# Patient Record
Sex: Male | Born: 1985 | Race: White | Hispanic: No | Marital: Married | Smoking: Never smoker
Health system: Southern US, Community
[De-identification: ages and names within clinical notes are randomized; demographics above are authoritative.]

## PROBLEM LIST (undated history)

## (undated) DIAGNOSIS — F419 Anxiety disorder, unspecified: Secondary | ICD-10-CM

## (undated) DIAGNOSIS — T148XXA Other injury of unspecified body region, initial encounter: Secondary | ICD-10-CM

## (undated) DIAGNOSIS — F32A Depression, unspecified: Secondary | ICD-10-CM

## (undated) DIAGNOSIS — F329 Major depressive disorder, single episode, unspecified: Secondary | ICD-10-CM

## (undated) HISTORY — DX: Major depressive disorder, single episode, unspecified: F32.9

## (undated) HISTORY — PX: MOUTH SURGERY: SHX715

## (undated) HISTORY — DX: Anxiety disorder, unspecified: F41.9

## (undated) HISTORY — DX: Other injury of unspecified body region, initial encounter: T14.8XXA

## (undated) HISTORY — DX: Depression, unspecified: F32.A

---

## 2017-02-01 ENCOUNTER — Emergency Department
Admission: EM | Admit: 2017-02-01 | Discharge: 2017-02-01 | Disposition: A | Discharge status: Home / Self Care | Payer: Worker's Compensation | Attending: Emergency Medicine | Admitting: Emergency Medicine

## 2017-02-01 ENCOUNTER — Emergency Department: Payer: Worker's Compensation

## 2017-02-01 ENCOUNTER — Encounter: Payer: Self-pay | Admitting: Emergency Medicine

## 2017-02-01 DIAGNOSIS — S20212A Contusion of left front wall of thorax, initial encounter: Secondary | ICD-10-CM | POA: Diagnosis not present

## 2017-02-01 DIAGNOSIS — Y9389 Activity, other specified: Secondary | ICD-10-CM | POA: Diagnosis not present

## 2017-02-01 DIAGNOSIS — Y929 Unspecified place or not applicable: Secondary | ICD-10-CM | POA: Diagnosis not present

## 2017-02-01 DIAGNOSIS — W1789XA Other fall from one level to another, initial encounter: Secondary | ICD-10-CM | POA: Insufficient documentation

## 2017-02-01 DIAGNOSIS — Y990 Civilian activity done for income or pay: Secondary | ICD-10-CM | POA: Insufficient documentation

## 2017-02-01 DIAGNOSIS — S32592A Other specified fracture of left pubis, initial encounter for closed fracture: Secondary | ICD-10-CM

## 2017-02-01 DIAGNOSIS — S79912A Unspecified injury of left hip, initial encounter: Secondary | ICD-10-CM | POA: Diagnosis present

## 2017-02-01 DIAGNOSIS — S7002XA Contusion of left hip, initial encounter: Secondary | ICD-10-CM

## 2017-02-01 DIAGNOSIS — S7012XA Contusion of left thigh, initial encounter: Secondary | ICD-10-CM | POA: Insufficient documentation

## 2017-02-01 DIAGNOSIS — S32512A Fracture of superior rim of left pubis, initial encounter for closed fracture: Secondary | ICD-10-CM | POA: Diagnosis not present

## 2017-02-01 DIAGNOSIS — W19XXXA Unspecified fall, initial encounter: Secondary | ICD-10-CM

## 2017-02-01 MED ORDER — IBUPROFEN 800 MG PO TABS
800.0000 mg | ORAL_TABLET | Freq: Once | ORAL | Status: AC
  Administered 2017-02-01: 800 mg via ORAL
  Filled 2017-02-01: qty 1

## 2017-02-01 MED ORDER — IBUPROFEN 800 MG PO TABS: 800.0000 mg | ORAL_TABLET | Freq: Three times a day (TID) | ORAL | 0 refills | Status: DC | PRN

## 2017-02-01 NOTE — ED Provider Notes (Signed)
[facility]-EMERGENCY DEPARTMENT Provider Note   CSN: [id number] Arrival date & time: 02/01/17  0944     History   Chief Complaint Chief Complaint  Patient presents with  . Fall    HPI Adley Mazurowski is a 31 y.o. male presents to the emergency department for evaluation of left lateral hip pain, left rib pain. Patient fell off of a stage approximately 6 feet around 9 AM this morning. Patient denies hitting his head, losing consciousness, having a headache or any vision changes. No nausea or vomiting. He states his pain is 4 out of 10 in the left hip and 3 out of 10 in the left ribs along the lateral left shoulder blade. He was able to ambulate but has moderate pain with ambulation along the left hip. He denies any chest pain or shortness of breath. He has not had any medication for pain. No numbness or tingling in the upper or lower extremities.  HPI  History reviewed. No pertinent past medical history.  There are no active problems to display for this patient.   Past Surgical History:  Procedure Laterality Date  . MOUTH SURGERY         Home Medications    Prior to Admission medications   Medication Sig Start Date End Date Taking? Authorizing Provider  ibuprofen (ADVIL,MOTRIN) 800 MG tablet Take 1 tablet (800 mg total) by mouth every 8 (eight) hours as needed. 02/01/17   Evon Slack, PA-C    Family History No family history on file.  Social History Social History  Substance Use Topics  . Smoking status: Never Smoker  . Smokeless tobacco: Never Used  . Alcohol use No     Allergies   Patient has no known allergies.   Review of Systems Review of Systems  Constitutional: Negative.  Negative for activity change, appetite change, chills and fever.  HENT: Negative for congestion, ear pain, mouth sores, rhinorrhea, sinus pressure, sore throat and trouble swallowing.   Eyes: Negative for photophobia, pain and discharge.  Respiratory: Negative for cough, chest tightness  and shortness of breath.   Cardiovascular: Negative for chest pain and leg swelling.       Left rib pain  Gastrointestinal: Negative for abdominal distention, abdominal pain, diarrhea, nausea and vomiting.  Genitourinary: Negative for difficulty urinating and dysuria.  Musculoskeletal: Positive for arthralgias and myalgias. Negative for back pain, gait problem and neck pain.  Skin: Negative for color change and rash.  Neurological: Negative for dizziness and headaches.  Hematological: Negative for adenopathy.  Psychiatric/Behavioral: Negative for agitation and behavioral problems.     Physical Exam Updated Vital Signs BP 120/70 (BP Location: Right Arm)   Pulse 63   Temp 98.4 F (36.9 C) (Oral)   Resp 16   Wt 76.2 kg   SpO2 100%   Physical Exam  Constitutional: He appears well-developed and well-nourished.  HENT:  Head: Normocephalic and atraumatic.  Right Ear: External ear normal.  Left Ear: External ear normal.  Nose: Nose normal.  Eyes: Conjunctivae and EOM are normal. Pupils are equal, round, and reactive to light. Right eye exhibits no discharge. Left eye exhibits no discharge.  Neck: Normal range of motion. Neck supple.  Cardiovascular: Normal rate and regular rhythm.   No murmur heard. Pulmonary/Chest: Effort normal. No respiratory distress. He has no wheezes. He has no rales. He exhibits no tenderness.  Abdominal: Soft. There is no tenderness. There is no guarding.  Musculoskeletal:  Patient has no spinous process tenderness along the cervical  thoracic or lumbar spine area and there is no bruising or swelling. Patient has full range of motion of the cervical thoracic and lumbar spine with no discomfort. Patient has tenderness along the left lateral scapular border along the left ribs with no step-off noted. There is no bruising. He is also tender along the left trochanteric bursa with no bruising or ecchymosis noted. He is able to straight leg raise. He has negative  logroll test. Mild tenderness on the left inferior pubic Rami to palpation. Patient has no pain with resisted knee flexion or extension.  Neurological: He is alert. Coordination normal.  Skin: Skin is warm and dry. No rash noted. No erythema.  Psychiatric: He has a normal mood and affect.  Nursing note and vitals reviewed.    ED Treatments / Results  Labs (all labs ordered are listed, but only abnormal results are displayed) Labs Reviewed - No data to display  EKG  EKG Interpretation None       Radiology Dg Ribs Unilateral W/chest Left  Result Date: 02/01/2017 CLINICAL DATA:  Larey Seat and injured left chest/ribs this morning. EXAM: LEFT RIBS AND CHEST - 3+ VIEW COMPARISON:  None. FINDINGS: The cardiac silhouette, mediastinal and hilar contours are normal. The lungs are clear. No pleural effusion or pneumothorax. Dedicated views of the left ribs do not demonstrate any definite acute left-sided rib fractures. No pleural thickening or pleural effusion. IMPRESSION: No acute cardiopulmonary findings and no definite left-sided rib fractures. Electronically Signed   By: Rudie Meyer M.D.   On: 02/01/2017 11:15   Dg Hip Unilat W Or Wo Pelvis 2-3 Views Left  Result Date: 02/01/2017 CLINICAL DATA:  Larey Seat 6 feet and injured left hip this morning. EXAM: DG HIP (WITH OR WITHOUT PELVIS) 2-3V LEFT COMPARISON:  None. FINDINGS: Both hips are normally located. No acute hip fracture. However, superior and inferior pubic rami fractures are noted. The pubic symphysis and SI joints are intact. IMPRESSION: Superior and inferior pubic rami fractures on the left side. No hip fracture. Electronically Signed   By: Rudie Meyer M.D.   On: 02/01/2017 11:17    Procedures Procedures (including critical care time)  Medications Ordered in ED Medications  ibuprofen (ADVIL,MOTRIN) tablet 800 mg (800 mg Oral Given 02/01/17 1109)     Initial Impression / Assessment and Plan / ED Course  I have reviewed the triage vital  signs and the nursing notes.  Pertinent labs & imaging results that were available during my care of the patient were reviewed by me and considered in my medical decision making (see chart for details).     31 year old male with fall just prior to arrival, x-ray showed no evidence of fracture along the chest her ribs. No pneumothorax. He does have a superior and inferior pubic rami fractures of the left pelvis. He is given crutches to help with ambulation. He will avoid sitting on a hard surface. He is given prescription for ibuprofen. Will follow-up with orthopedics.  Final Clinical Impressions(s) / ED Diagnoses   Final diagnoses:  Fall, initial encounter  Contusion of left hip and thigh, initial encounter  Rib contusion, left, initial encounter  Closed fracture of multiple pubic rami, left, initial encounter (HCC)    New Prescriptions New Prescriptions   IBUPROFEN (ADVIL,MOTRIN) 800 MG TABLET    Take 1 tablet (800 mg total) by mouth every 8 (eight) hours as needed.     Evon Slack, PA-C 02/01/17 1132    Jene Every, MD 02/01/17 [phone]

## 2017-02-01 NOTE — Discharge Instructions (Signed)
Please take ibuprofen as prescribed. Use crutches as needed for ambulation. Follow-up with orthopedics Monday or Tuesday morning and schedule follow-up appointment for your pelvic fracture.

## 2017-02-01 NOTE — ED Triage Notes (Signed)
Pt states that he was working today and fell through a hole in the stage. Pt currently c/o left leg pain and left scapula pain. Pt denies LOC, does not take blood thinners. Pt denies hitting head.

## 2018-02-02 IMAGING — CR DG HIP (WITH OR WITHOUT PELVIS) 2-3V*L*
1 series · 3 of 3 positions shown · non-contrast
Comparison: None.

CLINICAL DATA: Fell 6 feet and injured left hip this morning.

EXAM:
DG HIP (WITH OR WITHOUT PELVIS) 2-3V LEFT

[Series 1: dg hip unilat w or w/o pelvis 2-3 views  · non-contrast · 0.14mm/px · 3 of 3 slices shown]
[im 1/3]
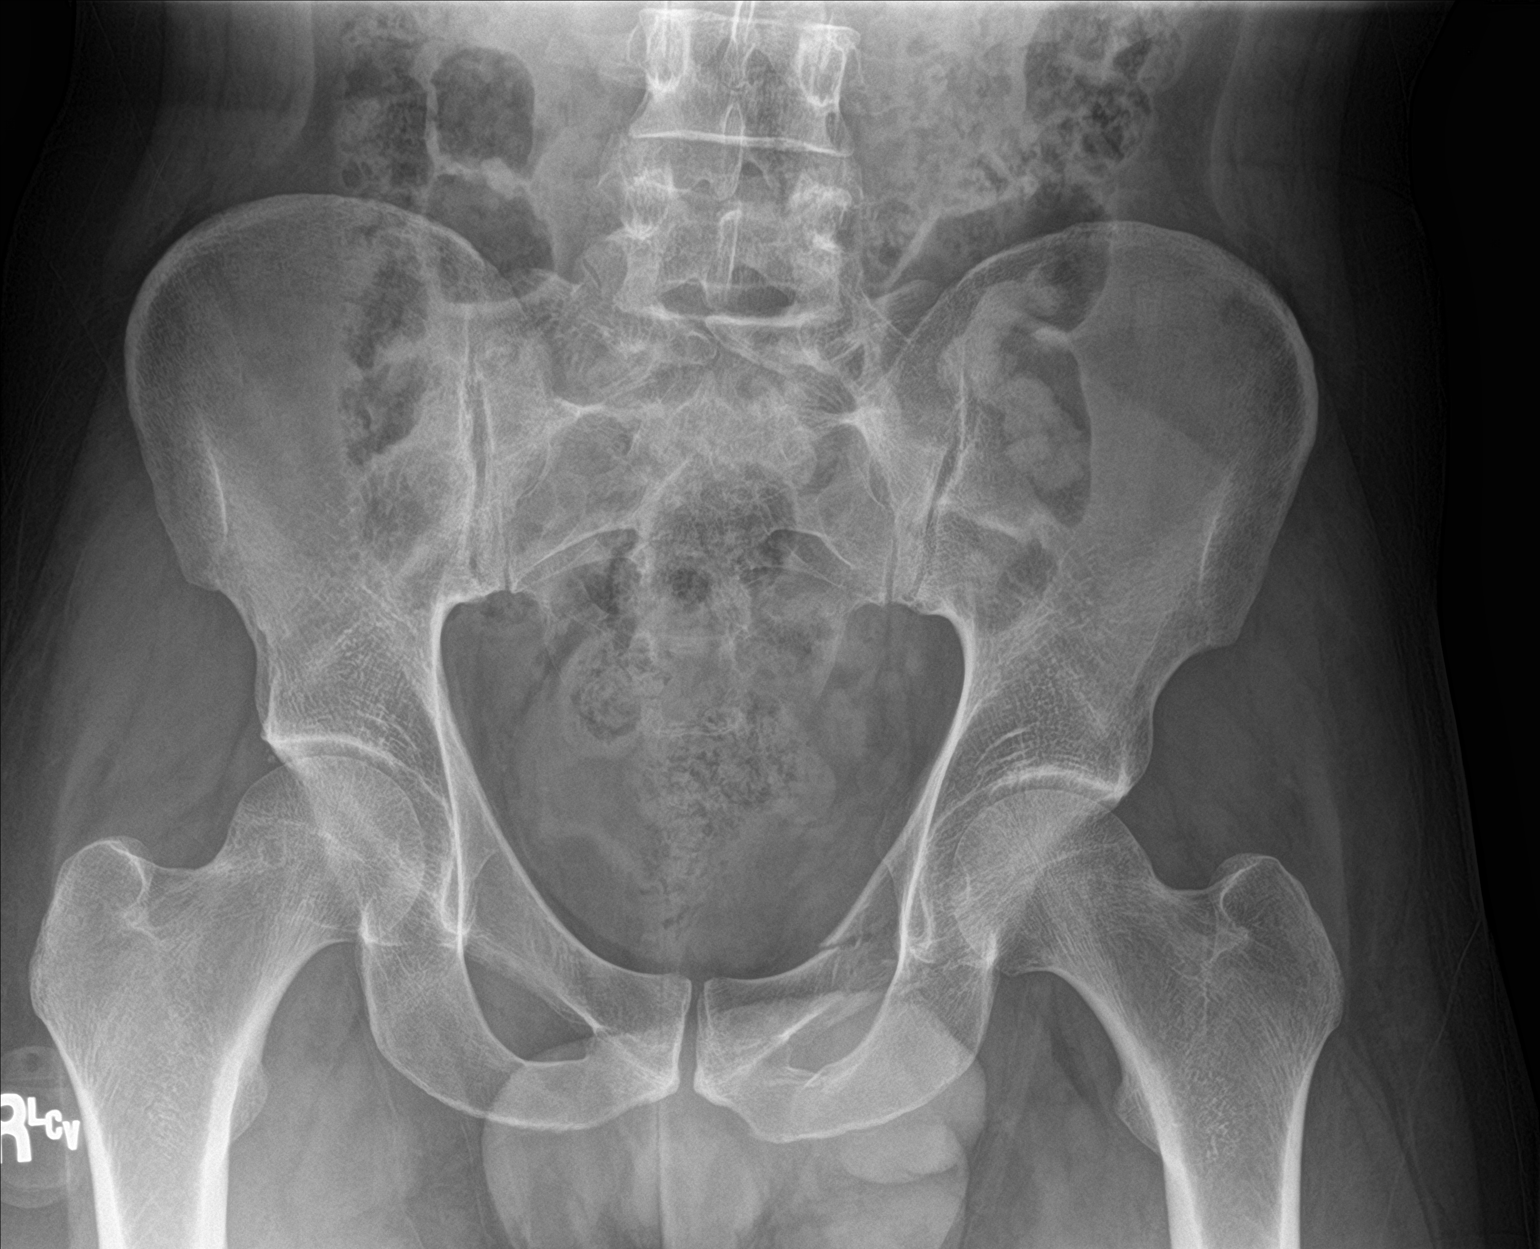
[im 2/3]
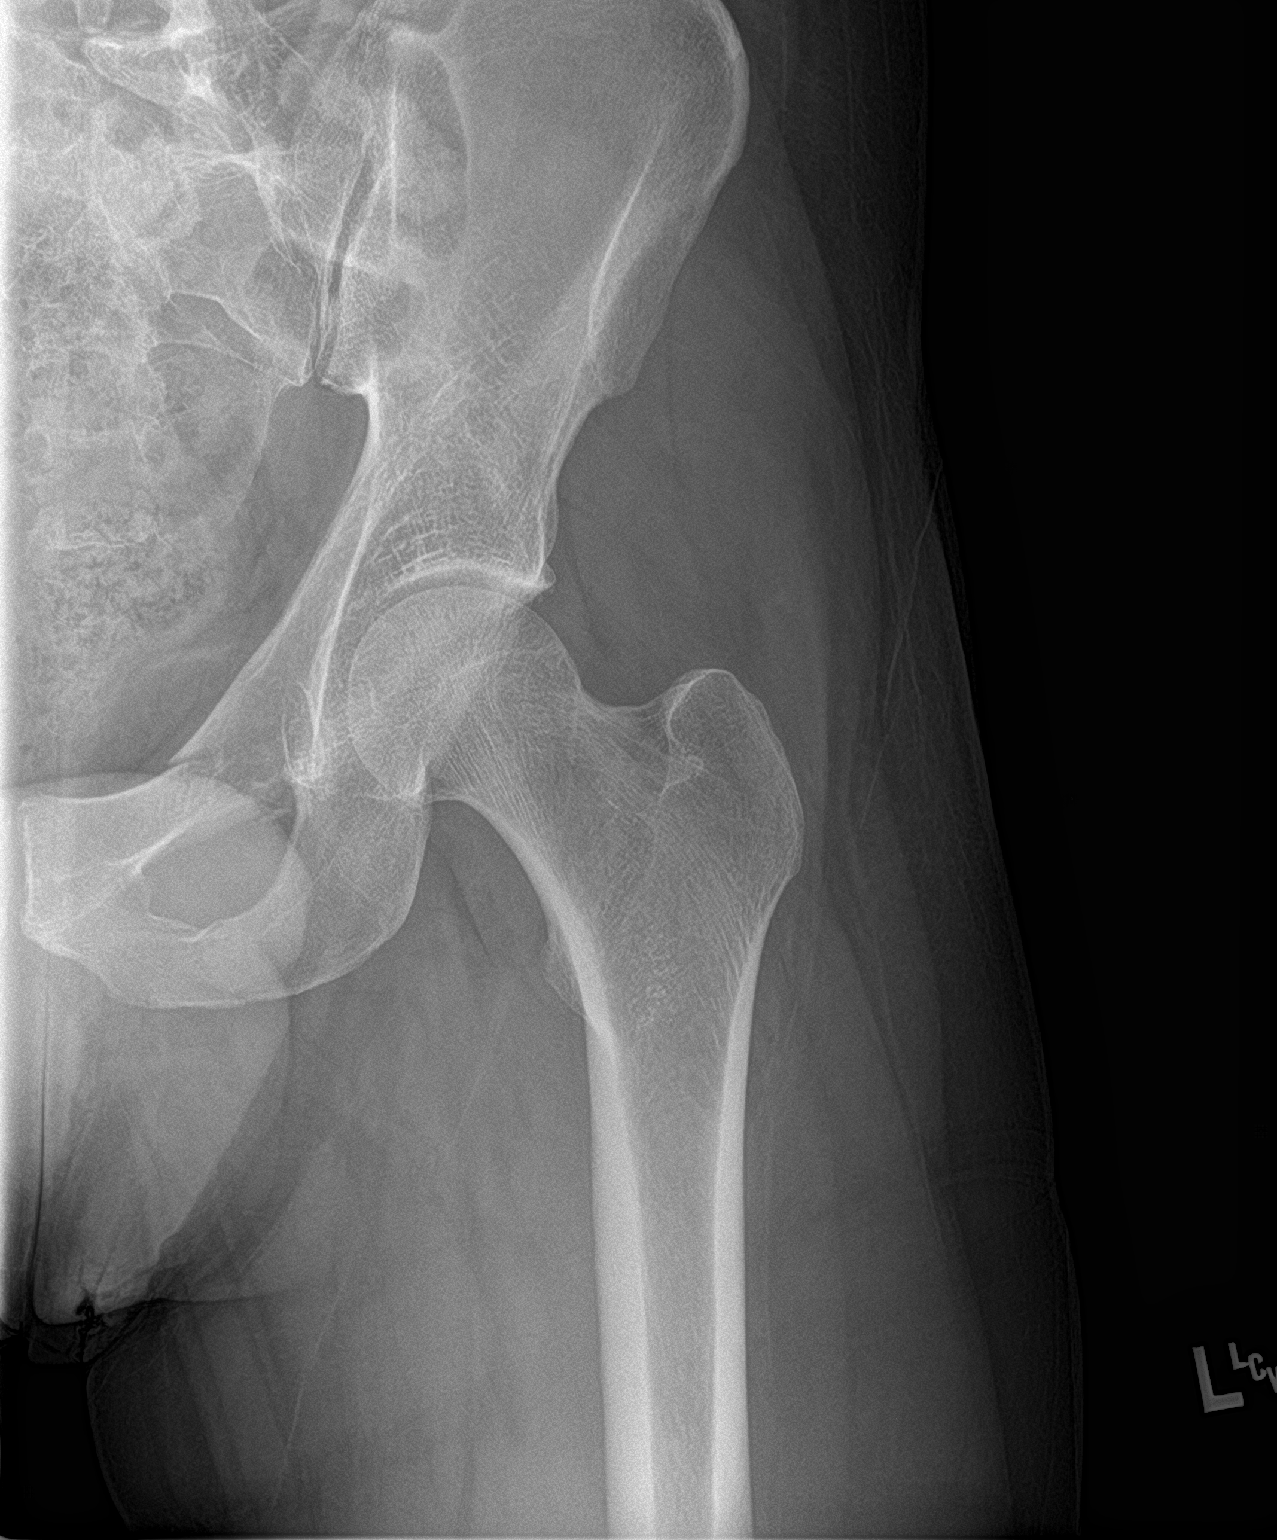
[im 3/3]
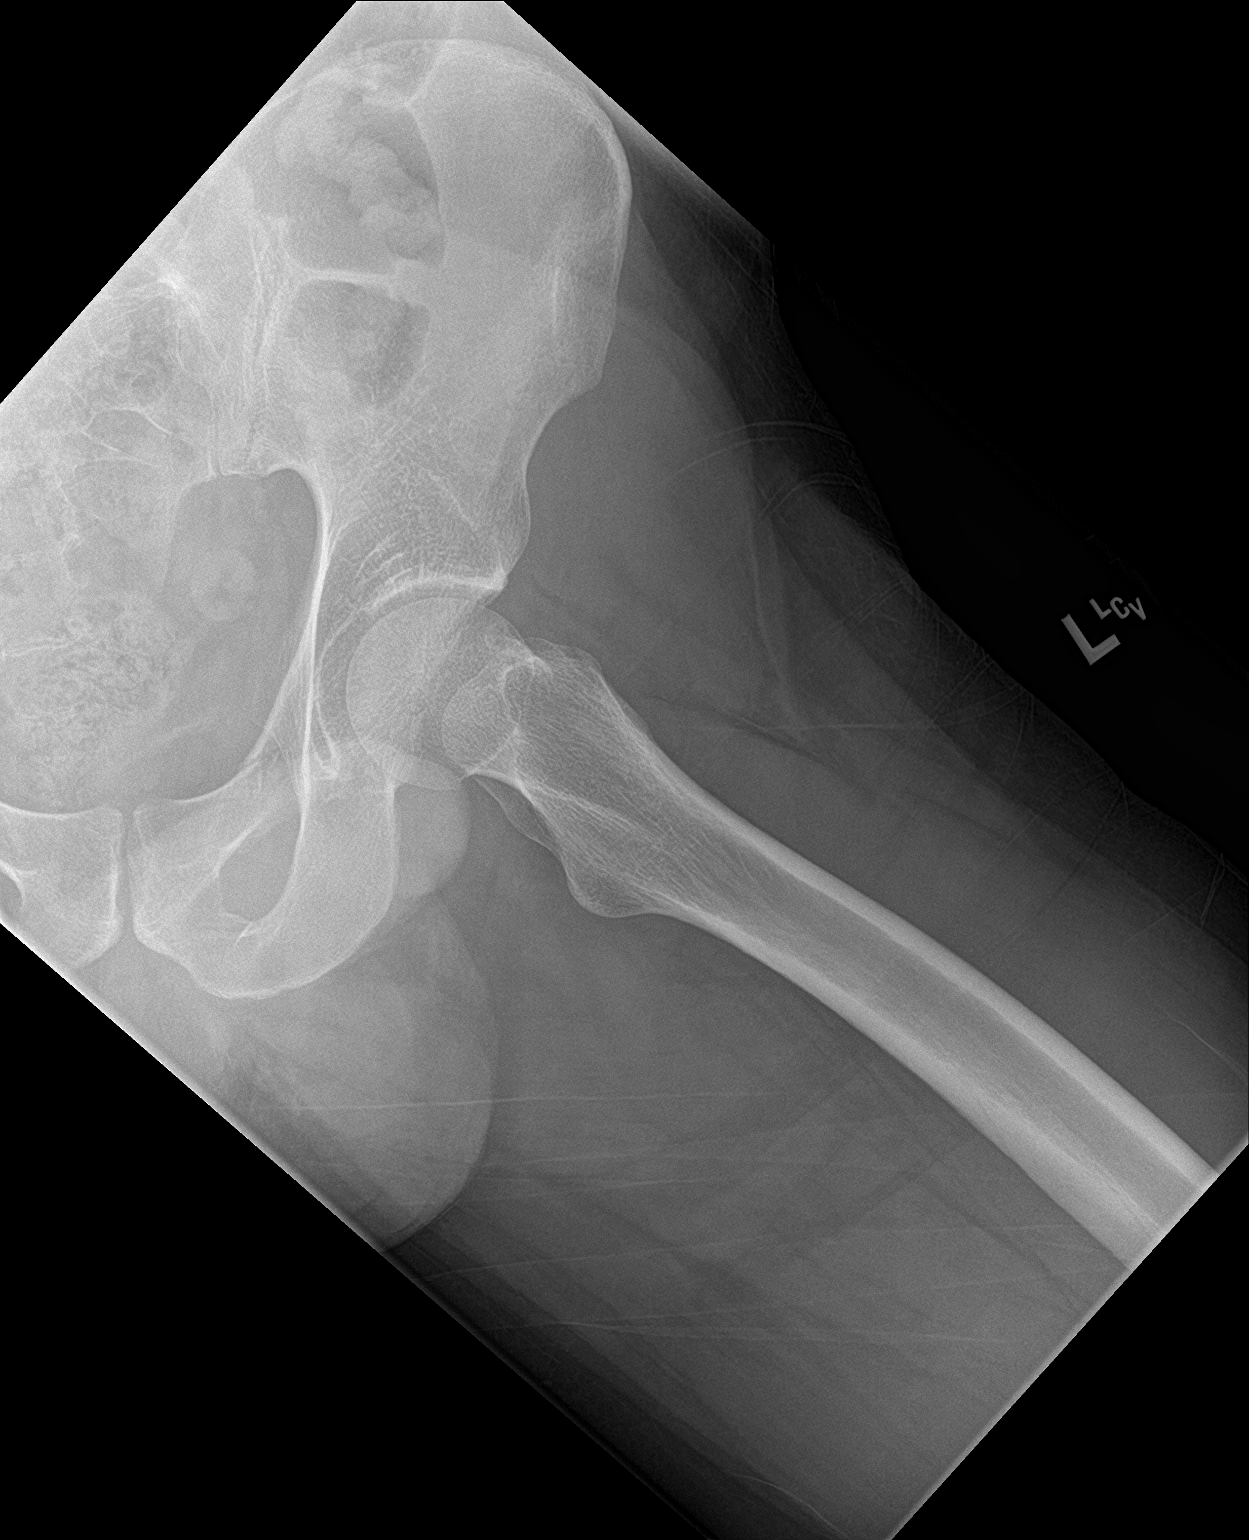

[3 of 3 positions shown; findings below may reference images not displayed]

FINDINGS: Both hips are normally located. No acute hip fracture. However,
superior and inferior pubic rami fractures are noted. The pubic
symphysis and SI joints are intact.
IMPRESSION: Superior and inferior pubic rami fractures on the left side. No hip
fracture.

## 2018-07-26 ENCOUNTER — Ambulatory Visit: Payer: Self-pay | Admitting: Family Medicine

## 2018-08-13 ENCOUNTER — Other Ambulatory Visit: Payer: Self-pay

## 2018-08-13 ENCOUNTER — Other Ambulatory Visit: Payer: Self-pay | Admitting: Family Medicine

## 2018-08-13 ENCOUNTER — Encounter: Payer: Self-pay | Admitting: Family Medicine

## 2018-08-13 ENCOUNTER — Ambulatory Visit (INDEPENDENT_AMBULATORY_CARE_PROVIDER_SITE_OTHER): Payer: 59 | Admitting: Family Medicine

## 2018-08-13 VITALS — BP 116/74 | HR 56 | Temp 98.2°F | Ht 69.0 in | Wt 171.3 lb

## 2018-08-13 DIAGNOSIS — F419 Anxiety disorder, unspecified: Secondary | ICD-10-CM

## 2018-08-13 DIAGNOSIS — R197 Diarrhea, unspecified: Secondary | ICD-10-CM | POA: Diagnosis not present

## 2018-08-13 DIAGNOSIS — Z13220 Encounter for screening for lipoid disorders: Secondary | ICD-10-CM | POA: Diagnosis not present

## 2018-08-13 DIAGNOSIS — H9312 Tinnitus, left ear: Secondary | ICD-10-CM | POA: Diagnosis not present

## 2018-08-13 DIAGNOSIS — Z23 Encounter for immunization: Secondary | ICD-10-CM | POA: Diagnosis not present

## 2018-08-13 NOTE — Patient Instructions (Addendum)
[email or web address]  Lactobacilis and Acidophilus  Tdap Vaccine (Tetanus, Diphtheria and Pertussis): What You Need to Know 1. Why get vaccinated? Tetanus, diphtheria and pertussis are very serious diseases. Tdap vaccine can protect [place] from these diseases. And, Tdap vaccine given to pregnant women can protect newborn babies against pertussis. TETANUS (Lockjaw) is rare in the [place] today. It causes painful muscle tightening and stiffness, usually all over the body.  It can lead to tightening of muscles in the head and neck so you can't open your mouth, swallow, or sometimes even breathe. Tetanus kills about 1 out of 10 people who are infected even after receiving the best medical care.  DIPHTHERIA is also rare in the [place] today. It can cause a thick coating to form in the back of the throat.  It can lead to breathing problems, heart failure, paralysis, and death.  PERTUSSIS (Whooping Cough) causes severe coughing spells, which can cause difficulty breathing, vomiting and disturbed sleep.  It can also lead to weight loss, incontinence, and rib fractures. Up to 2 in 100 adolescents and 5 in 100 adults with pertussis are hospitalized or have complications, which could include pneumonia or death.  These diseases are caused by bacteria. Diphtheria and pertussis are spread from person to person through secretions from coughing or sneezing. Tetanus enters the body through cuts, scratches, or wounds. Before vaccines, as many as 200,000 cases of diphtheria, 200,000 cases of pertussis, and hundreds of cases of tetanus, were reported in the [place] each year. Since vaccination began, reports of cases for tetanus and diphtheria have dropped by about 99% and for pertussis by about 80%. 2. Tdap vaccine Tdap vaccine can protect adolescents and adults from tetanus, diphtheria, and pertussis. One dose of Tdap is routinely given at age 20 or 90. People who did not get Tdap at that age should  get it as soon as possible. Tdap is especially important for healthcare professionals and anyone having close contact with a baby younger than 12 months. Pregnant women should get a dose of Tdap during every pregnancy, to protect the newborn from pertussis. Infants are most at risk for severe, life-threatening complications from pertussis. Another vaccine, called Td, protects against tetanus and diphtheria, but not pertussis. A Td booster should be given every 10 years. Tdap may be given as one of these boosters if you have never gotten Tdap before. Tdap may also be given after a severe cut or burn to prevent tetanus infection. Your doctor or the person giving you the vaccine can give you more information. Tdap may safely be given at the same time as other vaccines. 3. Some people should not get this vaccine  A person who has ever had a life-threatening allergic reaction after a previous dose of any diphtheria, tetanus or pertussis containing vaccine, OR has a severe allergy to any part of this vaccine, should not get Tdap vaccine. Tell the person giving the vaccine about any severe allergies.  Anyone who had coma or long repeated seizures within 7 days after a childhood dose of DTP or DTaP, or a previous dose of Tdap, should not get Tdap, unless a cause other than the vaccine was found. They can still get Td.  Talk to your doctor if you: ? have seizures or another nervous system problem, ? had severe pain or swelling after any vaccine containing diphtheria, tetanus or pertussis, ? ever had a condition called Guillain-Barr Syndrome (GBS), ? aren't feeling well on the day the shot is scheduled.  4. Risks With any medicine, including vaccines, there is a chance of side effects. These are usually mild and go away on their own. Serious reactions are also possible but are rare. Most people who get Tdap vaccine do not have any problems with it. Mild problems following Tdap: (Did not interfere with  activities)  Pain where the shot was given (about 3 in 4 adolescents or 2 in 3 adults)  Redness or swelling where the shot was given (about 1 person in 5)  Mild fever of at least 100.21F (up to about 1 in 25 adolescents or 1 in 100 adults)  Headache (about 3 or 4 people in 10)  Tiredness (about 1 person in 3 or 4)  Nausea, vomiting, diarrhea, stomach ache (up to 1 in 4 adolescents or 1 in 10 adults)  Chills, sore joints (about 1 person in 10)  Body aches (about 1 person in 3 or 4)  Rash, swollen glands (uncommon)  Moderate problems following Tdap: (Interfered with activities, but did not require medical attention)  Pain where the shot was given (up to 1 in 5 or 6)  Redness or swelling where the shot was given (up to about 1 in 16 adolescents or 1 in 12 adults)  Fever over 102F (about 1 in 100 adolescents or 1 in 250 adults)  Headache (about 1 in 7 adolescents or 1 in 10 adults)  Nausea, vomiting, diarrhea, stomach ache (up to 1 or 3 people in 100)  Swelling of the entire arm where the shot was given (up to about 1 in 500).  Severe problems following Tdap: (Unable to perform usual activities; required medical attention)  Swelling, severe pain, bleeding and redness in the arm where the shot was given (rare).  Problems that could happen after any vaccine:  People sometimes faint after a medical procedure, including vaccination. Sitting or lying down for about 15 minutes can help prevent fainting, and injuries caused by a fall. Tell your doctor if you feel dizzy, or have vision changes or ringing in the ears.  Some people get severe pain in the shoulder and have difficulty moving the arm where a shot was given. This happens very rarely.  Any medication can cause a severe allergic reaction. Such reactions from a vaccine are very rare, estimated at fewer than 1 in a million doses, and would happen within a few minutes to a few hours after the vaccination. As with any  medicine, there is a very remote chance of a vaccine causing a serious injury or death. The safety of vaccines is always being monitored. For more information, visit: http://floyd.org/ 5. What if there is a serious problem? What should I look for? Look for anything that concerns you, such as signs of a severe allergic reaction, very high fever, or unusual behavior. Signs of a severe allergic reaction can include hives, swelling of the face and throat, difficulty breathing, a fast heartbeat, dizziness, and weakness. These would usually start a few minutes to a few hours after the vaccination. What should I do?  If you think it is a severe allergic reaction or other emergency that can't wait, call 9-1-1 or get the person to the nearest hospital. Otherwise, call your doctor.  Afterward, the reaction should be reported to the Vaccine Adverse Event Reporting System (VAERS). Your doctor might file this report, or you can do it yourself through the VAERS web site at www.vaers.LAgents.no, or by calling 1-(939) 451-7164. ? VAERS does not give medical advice. 6. The [employer]  Vaccine Injury Compensation Program The National Vaccine Injury Compensation Program (VICP) is a federal program that was created to compensate people who may have been injured by certain vaccines. Persons who believe they may have been injured by a vaccine can learn about the program and about filing a claim by calling 1-3862902910 or visiting the VICP website at SpiritualWord.at. There is a time limit to file a claim for compensation. 7. How can I learn more?  Ask your doctor. He or she can give you the vaccine package insert or suggest other sources of information.  Call your local or state health department.  Contact the Centers for Disease Control and Prevention (CDC): ? Call 8174652867 (1-800-CDC-INFO) or ? Visit CDC's website at PicCapture.uy CDC Tdap Vaccine VIS (12/20/13) This information  is not intended to replace advice given to you by your health care provider. Make sure you discuss any questions you have with your health care provider. Document Released: 04/13/2012 Document Revised: 07/03/2016 Document Reviewed: 07/03/2016 Elsevier Interactive Patient Education  2017 [employer].  Food Choices to Help Relieve Diarrhea, Adult When you have diarrhea, the foods you eat and your eating habits are very important. Choosing the right foods and drinks can help:  Relieve diarrhea.  Replace lost fluids and nutrients.  Prevent dehydration.  What general guidelines should I follow? Relieving diarrhea  Choose foods with less than 2 g or .07 oz. of fiber per serving.  Limit fats to less than 8 tsp (38 g or 1.34 oz.) a day.  Avoid the following: ? Foods and beverages sweetened with high-fructose corn syrup, honey, or sugar alcohols such as xylitol, sorbitol, and mannitol. ? Foods that contain a lot of fat or sugar. ? Fried, greasy, or spicy foods. ? High-fiber grains, breads, and cereals. ? Raw fruits and vegetables.  Eat foods that are rich in probiotics. These foods include dairy products such as yogurt and fermented milk products. They help increase healthy bacteria in the stomach and intestines (gastrointestinal tract, or GI tract).  If you have lactose intolerance, avoid dairy products. These may make your diarrhea worse.  Take medicine to help stop diarrhea (antidiarrheal medicine) only as told by your health care provider. Replacing nutrients  Eat small meals or snacks every 3-4 hours.  Eat bland foods, such as white rice, toast, or baked potato, until your diarrhea starts to get better. Gradually reintroduce nutrient-rich foods as tolerated or as told by your health care provider. This includes: ? Well-cooked protein foods. ? Peeled, seeded, and soft-cooked fruits and vegetables. ? Low-fat dairy products.  Take vitamin and mineral supplements as told by your  health care provider. Preventing dehydration   Start by sipping water or a special solution to prevent dehydration (oral rehydration solution, ORS). Urine that is clear or pale yellow means that you are getting enough fluid.  Try to drink at least 8-10 cups of fluid each day to help replace lost fluids.  You may add other liquids in addition to water, such as clear juice or decaffeinated sports drinks, as tolerated or as told by your health care provider.  Avoid drinks with caffeine, such as coffee, tea, or soft drinks.  Avoid alcohol. What foods are recommended? The items listed may not be a complete list. Talk with your health care provider about what dietary choices are best for you. Grains White rice. White, [place], or pita breads (fresh or toasted), including plain rolls, buns, or bagels. White pasta. Saltine, soda, or graham crackers. Pretzels. Low-fiber cereal.  Cooked cereals made with water (such as cornmeal, farina, or cream cereals). Plain muffins. Matzo. Melba toast. Zwieback. Vegetables Potatoes (without the skin). Most well-cooked and canned vegetables without skins or seeds. Tender lettuce. Fruits Apple sauce. Fruits canned in juice. Cooked apricots, cherries, grapefruit, peaches, pears, or plums. Fresh bananas and cantaloupe. Meats and other protein foods Baked or boiled chicken. Eggs. Tofu. Fish. Seafood. Smooth nut butters. Ground or well-cooked tender beef, ham, veal, lamb, pork, or poultry. Dairy Plain yogurt, kefir, and unsweetened liquid yogurt. Lactose-free milk, buttermilk, skim milk, or soy milk. Low-fat or nonfat hard cheese. Beverages Water. Low-calorie sports drinks. Fruit juices without pulp. Strained tomato and vegetable juices. Decaffeinated teas. Sugar-free beverages not sweetened with sugar alcohols. Oral rehydration solutions, if approved by your health care provider. Seasoning and other foods Bouillon, broth, or soups made from recommended foods. What  foods are not recommended? The items listed may not be a complete list. Talk with your health care provider about what dietary choices are best for you. Grains Whole grain, whole wheat, bran, or rye breads, rolls, pastas, and crackers. Wild or brown rice. Whole grain or bran cereals. Barley. Oats and oatmeal. Corn tortillas or taco shells. Granola. Popcorn. Vegetables Raw vegetables. Fried vegetables. Cabbage, broccoli, Brussels sprouts, artichokes, baked beans, beet greens, corn, kale, legumes, peas, sweet potatoes, and yams. Potato skins. Cooked spinach and cabbage. Fruits Dried fruit, including raisins and dates. Raw fruits. Stewed or dried prunes. Canned fruits with syrup. Meat and other protein foods Fried or fatty meats. Deli meats. Chunky nut butters. Nuts and seeds. Beans and lentils. Tomasa Blase. Hot dogs. Sausage. Dairy High-fat cheeses. Whole milk, chocolate milk, and beverages made with milk, such as milk shakes. Half-and-half. Cream. sour cream. Ice cream. Beverages Caffeinated beverages (such as coffee, tea, soda, or energy drinks). Alcoholic beverages. Fruit juices with pulp. Prune juice. Soft drinks sweetened with high-fructose corn syrup or sugar alcohols. High-calorie sports drinks. Fats and oils Butter. Cream sauces. Margarine. Salad oils. Plain salad dressings. Olives. Avocados. Mayonnaise. Sweets and desserts Sweet rolls, doughnuts, and sweet breads. Sugar-free desserts sweetened with sugar alcohols such as xylitol and sorbitol. Seasoning and other foods Honey. Hot sauce. Chili powder. Gravy. Cream-based or milk-based soups. Pancakes and waffles. Summary  When you have diarrhea, the foods you eat and your eating habits are very important.  Make sure you get at least 8-10 cups of fluid each day, or enough to keep your urine clear or pale yellow.  Eat bland foods and gradually reintroduce healthy, nutrient-rich foods as tolerated, or as told by your health care  provider.  Avoid high-fiber, fried, greasy, or spicy foods. This information is not intended to replace advice given to you by your health care provider. Make sure you discuss any questions you have with your health care provider. Document Released: 01/03/2004 Document Revised: 10/10/2016 Document Reviewed: 10/10/2016 Elsevier Interactive Patient Education  2018 [employer]. Probiotics What are probiotics? Probiotics are the good bacteria and yeasts that live in your body and keep you and your digestive system healthy. Probiotics also help your body's defense (immune) system and protect your body against bad bacterial growth. Certain foods contain probiotics, such as yogurt. Probiotics can also be purchased as a supplement. As with any supplement or drug, it is important to discuss its use with your health care provider. What affects the balance of bacteria in my body? The balance of bacteria in your body can be affected by:  Antibiotic medicines. Antibiotics are sometimes necessary to treat  infection. Unfortunately, they may kill good or friendly bacteria in your body as well as the bad bacteria. This may lead to stomach problems like diarrhea, gas, and cramping.  Disease. Some conditions are the result of an overgrowth of bad bacteria, yeasts, parasites, or fungi. These conditions include: ? Infectious diarrhea. ? Stomach and respiratory infections. ? Skin infections. ? Irritable bowel syndrome (IBS). ? Inflammatory bowel diseases. ? Ulcer due to Helicobacter pylori (H. pylori) infection. ? Tooth decay and periodontal disease. ? Vaginal infections.  Stress and poor diet may also lower the good bacteria in your body. What type of probiotic is right for me? Probiotics are available over the counter at your local pharmacy, health food, or grocery store. They come in many different forms, combinations of strains, and dosing strengths. Some may need to be refrigerated. Always read the label  for storage and usage instructions. Specific strains have been shown to be more effective for certain conditions. Ask your health care provider what option is best for you. Why would I need probiotics? There are many reasons your health care provider might recommend a probiotic supplement, including:  Diarrhea.  Constipation.  IBS.  Respiratory infections.  Yeast infections.  Acne, eczema, and other skin conditions.  Frequent urinary tract infections (UTIs).  Are there side effects of probiotics? Some people experience mild side effects when taking probiotics. Side effects are usually temporary and may include:  Gas.  Bloating.  Cramping.  Rarely, serious side effects, such as infection or immune system changes, may occur. What else do I need to know about probiotics?  There are many different strains of probiotics. Certain strains may be more effective depending on your condition. Probiotics are available in varying doses. Ask your health care provider which probiotic you should use and how often.  If you are taking probiotics along with antibiotics, it is generally recommended to wait at least 2 hours between taking the antibiotic and taking the probiotic. For more information: [facility] for Complementary and Alternative Medicine http://potts.com/ This information is not intended to replace advice given to you by your health care provider. Make sure you discuss any questions you have with your health care provider. Document Released: 05/10/2014 Document Revised: 09/09/2016 Document Reviewed: 01/10/2014 Elsevier Interactive Patient Education  2017 [employer]. Tinnitus Tinnitus refers to hearing a sound when there is no actual source for that sound. This is often described as ringing in the ears. However, people with this condition may hear a variety of noises. A person may hear the sound in one ear or in both ears. The sounds of tinnitus can be soft, loud, or  somewhere in between. Tinnitus can last for a few seconds or can be constant for days. It may go away without treatment and come back at various times. When tinnitus is constant or happens often, it can lead to other problems, such as trouble sleeping and trouble concentrating. Almost everyone experiences tinnitus at some point. Tinnitus that is long-lasting (chronic) or comes back often is a problem that may require medical attention. What are the causes? The cause of tinnitus is often not known. In some cases, it can result from other problems or conditions, including:  Exposure to loud noises from machinery, music, or other sources.  Hearing loss.  Ear or sinus infections.  Earwax buildup.  A foreign object in the ear.  Use of certain medicines.  Use of alcohol and caffeine.  High blood pressure.  Heart diseases.  Anemia.  Allergies.  Meniere disease.  Thyroid problems.  Tumors.  An enlarged part of a weakened blood vessel (aneurysm).  What are the signs or symptoms? The main symptom of tinnitus is hearing a sound when there is no source for that sound. It may sound like:  Buzzing.  Roaring.  Ringing.  Blowing air, similar to the sound heard when you listen to a seashell.  Hissing.  Whistling.  Sizzling.  Humming.  Running water.  A sustained musical note.  How is this diagnosed? Tinnitus is diagnosed based on your symptoms. Your health care provider will do a physical exam. A comprehensive hearing exam (audiologic exam) will be done if your tinnitus:  Affects only one ear (unilateral).  Causes hearing difficulties.  Lasts 6 months or longer.  You may also need to see a health care provider who specializes in hearing disorders (audiologist). You may be asked to complete a questionnaire to determine the severity of your tinnitus. Tests may be done to help determine the cause and to rule out other conditions. These can include:  Imaging studies of  your head and brain, such as: ? A CT scan. ? An MRI.  An imaging study of your blood vessels (angiogram).  How is this treated? Treating an underlying medical condition can sometimes make tinnitus go away. If your tinnitus continues, other treatments may include:  Medicines, such as certain antidepressants or sleeping aids.  Sound generators to mask the tinnitus. These include: ? Tabletop sound machines that play relaxing sounds to help you fall asleep. ? Wearable devices that fit in your ear and play sounds or music. ? A small device that uses headphones to deliver a signal embedded in music (acoustic neural stimulation). In time, this may change the pathways of your brain and make you less sensitive to tinnitus. This device is used for very severe cases when no other treatment is working.  Therapy and counseling to help you manage the stress of living with tinnitus.  Using hearing aids or cochlear implants, if your tinnitus is related to hearing loss.  Follow these instructions at home:  When possible, avoid being in loud places and being exposed to loud sounds.  Wear hearing protection, such as earplugs, when you are exposed to loud noises.  Do not take stimulants, such as nicotine, alcohol, or caffeine.  Practice techniques for reducing stress, such as meditation, yoga, or deep breathing.  Use a white noise machine, a humidifier, or other devices to mask the sound of tinnitus.  Sleep with your head slightly raised. This may reduce the impact of tinnitus.  Try to get plenty of rest each night. Contact a health care provider if:  You have tinnitus in just one ear.  Your tinnitus continues for 3 weeks or longer without stopping.  Home care measures are not helping.  You have tinnitus after a head injury.  You have tinnitus along with any of the following: ? Dizziness. ? Loss of balance. ? Nausea and vomiting. This information is not intended to replace advice given  to you by your health care provider. Make sure you discuss any questions you have with your health care provider. Document Released: 10/13/2005 Document Revised: 06/15/2016 Document Reviewed: 03/15/2014 Elsevier Interactive Patient Education  2018 [employer].

## 2018-08-13 NOTE — Assessment & Plan Note (Signed)
Hearing test normal. Would like to see ENT. Referral generated today. Checking labs. Await results. Call with any concerns.

## 2018-08-13 NOTE — Assessment & Plan Note (Signed)
Doing OK. Would like name of counselors- given today. Call with any concerns. Continue to monitor.

## 2018-08-13 NOTE — Progress Notes (Signed)
BP 116/74   Pulse (!) 56   Temp 98.2 F (36.8 C) (Oral)   Ht '5\' 9"'$  (1.753 m)   Wt 171 lb 4.8 oz (77.7 kg)   SpO2 99%   BMI 25.30 kg/m    Subjective:    Patient ID: Kenneth Richard, male    DOB: [birth year], 32 y.o.   MRN: [id number]  HPI: [name] is a 32 y.o. male who presents today to establish care.   Chief Complaint  Patient presents with  . New Patient (Initial Visit)    pt would like to discuss about his loose stools for a couple of months and left ear pain and discomfort   ABDOMINAL ISSUES Duration: 7 months from now Nature: loose stools, bloating Location: diffuse Severity: moderate  Radiation: no Frequency: every stool Alleviating factors: nothing Aggravating factors: nothing Treatments attempted: miralax Constipation: no Diarrhea: yes- occasionally Episodes of diarrhea/day: 3-4x a day when he has that, usually 1-3x a week Mucous in the stool: no Heartburn: no Bloating:yes Flatulence: no Nausea: no Vomiting: no Melena or hematochezia: no Rash: no Jaundice: no Fever: no Weight loss: no  Mood has been doing OK. 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 Description of tinnitus: buzzing Pulsatile: no Tinnitus duration: only when the sound goes on Episode frequency: recurrent Severity: moderate Aggravating factors: sounds, equalizing pressure in ears Alleviating factors: none Head injury: yes- 1-2 minor concussions Chronic exposure to loud noises: yes Exposure to ototoxic medications: no Vertigo: no Hearing loss: unknown Aural fullness: yes Headache:yes  TMJ syndrome symptoms: yes Unsteady gait: no Postural instability: no Diplopia, dysarthria, dysphagia or weakness: no Anxietydepression: yes   Active Ambulatory Problems    Diagnosis Date Noted  . 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 Anxiety 08/13/2018   Resolved Ambulatory Problems    Diagnosis Date Noted  . No Resolved Ambulatory Problems   Past Medical History:    Diagnosis Date  . Broken bones   . Depression    Past Surgical History:  Procedure Laterality Date  . MOUTH SURGERY     Outpatient Encounter Medications as of 08/13/2018  Medication Sig  . IBUPROFEN IB PO Take by mouth as needed.  . [DISCONTINUED] ibuprofen (ADVIL,MOTRIN) 800 MG tablet Take 1 tablet (800 mg total) by mouth every 8 (eight) hours as needed.   No facility-administered encounter medications on file as of 08/13/2018.    No Known Allergies  Social History   Socioeconomic History  . Marital status: Married    Spouse name: Not on file  . Number of children: Not on file  . Years of education: Not on file  . Highest education level: Not on file  Occupational History  . Not on file  Social Needs  . Financial resource strain: Not on file  . Food insecurity:    Worry: Not on file    Inability: Not on file  . Transportation needs:    Medical: Not on file    Non-medical: Not on file  Tobacco Use  . Smoking status: Never Smoker  . Smokeless tobacco: Never Used  Substance and Sexual Activity  . Alcohol use: Yes    Alcohol/week: 8.0 standard drinks    Types: 8 Glasses of wine per week  . Drug use: Not Currently  . Sexual activity: Yes  Lifestyle  . Physical activity:    Days per week: Not on file    Minutes per session: Not on file  . Stress: Not on file  Relationships  .  Social connections:    Talks on phone: Not on file    Gets together: Not on file    Attends religious service: Not on file    Active member of club or organization: Not on file    Attends meetings of clubs or organizations: Not on file    Relationship status: Not on file  . Intimate partner violence:    Fear of current or ex partner: Not on file    Emotionally abused: Not on file    Physically abused: Not on file    Forced sexual activity: Not on file  Other Topics Concern  . Not on file  Social History Narrative  . Not on file   Family History  Problem Relation Age of Onset  .  Anxiety disorder Mother   . Depression Mother   . Anxiety disorder Father   . Depression Father   . Cowden syndrome Brother   . Breast cancer Maternal Grandmother   . Leukemia Maternal Grandfather   . Multiple myeloma Maternal Grandfather   . Alzheimer's disease Paternal Grandfather     Review of Systems  Constitutional: Negative.   HENT: Positive for tinnitus. Negative for congestion, dental problem, drooling, ear discharge, ear pain, facial swelling, hearing loss, mouth sores, nosebleeds, postnasal drip, rhinorrhea, sinus pressure, sinus pain, sneezing, sore throat, trouble swallowing and voice change.   Respiratory: Negative.   Cardiovascular: Negative.   Gastrointestinal: Positive for diarrhea. Negative for abdominal distention, abdominal pain, anal bleeding, blood in stool, constipation, nausea, rectal pain and vomiting.  Skin: Negative.   Neurological: Negative.   Psychiatric/Behavioral: Negative.     Per HPI unless specifically indicated above     Objective:    BP 116/74   Pulse (!) 56   Temp 98.2 F (36.8 C) (Oral)   Ht '5\' 9"'$  (1.753 m)   Wt 171 lb 4.8 oz (77.7 kg)   SpO2 99%   BMI 25.30 kg/m   Wt Readings from Last 3 Encounters:  08/13/18 171 lb 4.8 oz (77.7 kg)  02/01/17 168 lb (76.2 kg)     Hearing Screening   '125Hz'$  '250Hz'$  '500Hz'$  '1000Hz'$  '2000Hz'$  '3000Hz'$  '4000Hz'$  '6000Hz'$  '8000Hz'$   Right ear:   '20 20 20  20    '$ Left ear:   '20 20 20  20      '$ Physical Exam  Constitutional: He is oriented to person, place, and time. He appears well-developed and well-nourished. No distress.  HENT:  Head: Normocephalic and atraumatic.  Right Ear: Hearing and external ear normal.  Left Ear: Hearing and external ear normal.  Nose: Nose normal.  Mouth/Throat: Oropharynx is clear and moist. No oropharyngeal exudate.  Eyes: Pupils are equal, round, and reactive to light. Conjunctivae, EOM and lids are normal. Right eye exhibits no discharge. Left eye exhibits no discharge. No scleral  icterus.  Neck: Normal range of motion. Neck supple. No JVD present. No tracheal deviation present. No thyromegaly present.  Cardiovascular: Normal rate, regular rhythm, normal heart sounds and intact distal pulses. Exam reveals no gallop and no friction rub.  No murmur heard. Pulmonary/Chest: Effort normal and breath sounds normal. No stridor. No respiratory distress. He has no wheezes. He has no rales. He exhibits no tenderness.  Abdominal: Soft. Bowel sounds are normal. He exhibits no distension and no mass. There is no tenderness. There is no rebound and no guarding. No hernia.  Musculoskeletal: Normal range of motion.  Lymphadenopathy:    He has no cervical adenopathy.  Neurological: He is alert  and oriented to person, place, and time.  Skin: Skin is warm, dry and intact. Capillary refill takes less than 2 seconds. No rash noted. He is not diaphoretic. No erythema. No pallor.  Psychiatric: He has a normal mood and affect. His speech is normal and behavior is normal. Judgment and thought content normal. Cognition and memory are normal.  Nursing note and vitals reviewed.   No results found for this or any previous visit.    Assessment & Plan:   Problem List Items Addressed This Visit      Other   Tinnitus of left ear    Hearing test normal. Would like to see ENT. Referral generated today. Checking labs. Await results. Call with any concerns.       Relevant Orders   Ambulatory referral to ENT   Anxiety    Doing OK. Would like name of counselors- given today. Call with any concerns. Continue to monitor.        Other Visit Diagnoses    Diarrhea, unspecified type    -  Primary   Likely functional. Will check labs. Await results. Start probiotic. Call with any concerns. Continue to monitor.    Relevant Orders   CBC with Differential/Platelet   Comprehensive metabolic panel   UA/M w/rflx Culture, Routine   VITAMIN D 25 Hydroxy (Vit-D Deficiency, Fractures)   Thyroid Panel With  TSH   Gliadin antibodies, serum   Tissue transglutaminase, IgA   Reticulin Antibody, IgA w reflex titer   Need for Tdap vaccination       tdap given today   Relevant Orders   Tdap vaccine greater than or equal to 7yo IM (Completed)   Screening for cholesterol level       Labs drawn today.   Relevant Orders   Lipid Panel w/o Chol/HDL Ratio       Follow up plan: Return in about 6 months (around 02/12/2019) for Physical.

## 2018-08-14 LAB — UA/M W/RFLX CULTURE, ROUTINE
Bilirubin, UA: NEGATIVE
Glucose, UA: NEGATIVE
Ketones, UA: NEGATIVE
Leukocytes, UA: NEGATIVE
NITRITE UA: NEGATIVE
Protein, UA: NEGATIVE
RBC UA: NEGATIVE
SPEC GRAV UA: 1.02 (ref 1.005–1.030)
UUROB: 0.2 mg/dL (ref 0.2–1.0)
pH, UA: 7.5 (ref 5.0–7.5)

## 2018-08-18 LAB — LIPID PANEL W/O CHOL/HDL RATIO
CHOLESTEROL TOTAL: 131 mg/dL (ref 100–199)
HDL: 56 mg/dL (ref >39)
LDL Calculated: 59 mg/dL (ref 0–99)
Triglycerides: 78 mg/dL (ref 0–149)
VLDL Cholesterol Cal: 16 mg/dL (ref 5–40)

## 2018-08-18 LAB — RETICULIN ANTIBODIES, IGA W TITER: Reticulin Ab, IgA: NEGATIVE titer (ref <2.5)

## 2018-08-18 LAB — COMPREHENSIVE METABOLIC PANEL
ALT: 12 IU/L (ref 0–44)
AST: 12 IU/L (ref 0–40)
Albumin/Globulin Ratio: 2.4 — ABNORMAL HIGH (ref 1.2–2.2)
Albumin: 4.6 g/dL (ref 3.5–5.5)
Alkaline Phosphatase: 55 IU/L (ref 39–117)
BUN/Creatinine Ratio: 17 (ref 9–20)
BUN: 12 mg/dL (ref 6–20)
Bilirubin Total: 0.6 mg/dL (ref 0.0–1.2)
CO2: 25 mmol/L (ref 20–29)
CREATININE: 0.72 mg/dL — AB (ref 0.76–1.27)
Calcium: 9 mg/dL (ref 8.7–10.2)
Chloride: 103 mmol/L (ref 96–106)
GFR calc Af Amer: 143 mL/min/{1.73_m2} (ref >59)
GFR calc non Af Amer: 123 mL/min/{1.73_m2} (ref >59)
GLOBULIN, TOTAL: 1.9 g/dL (ref 1.5–4.5)
Glucose: 87 mg/dL (ref 65–99)
POTASSIUM: 4.2 mmol/L (ref 3.5–5.2)
SODIUM: 141 mmol/L (ref 134–144)
Total Protein: 6.5 g/dL (ref 6.0–8.5)

## 2018-08-18 LAB — CBC WITH DIFFERENTIAL/PLATELET
BASOS ABS: 0.1 10*3/uL (ref 0.0–0.2)
Basos: 1 %
EOS (ABSOLUTE): 0.1 10*3/uL (ref 0.0–0.4)
Eos: 2 %
HEMATOCRIT: 40.3 % (ref 37.5–51.0)
Hemoglobin: 14 g/dL (ref 13.0–17.7)
IMMATURE GRANS (ABS): 0 10*3/uL (ref 0.0–0.1)
IMMATURE GRANULOCYTES: 0 %
LYMPHS: 30 %
Lymphocytes Absolute: 1.6 10*3/uL (ref 0.7–3.1)
MCH: 31.9 pg (ref 26.6–33.0)
MCHC: 34.7 g/dL (ref 31.5–35.7)
MCV: 92 fL (ref 79–97)
Monocytes Absolute: 0.4 10*3/uL (ref 0.1–0.9)
Monocytes: 8 %
NEUTROS PCT: 59 %
Neutrophils Absolute: 3.2 10*3/uL (ref 1.4–7.0)
PLATELETS: 282 10*3/uL (ref 150–450)
RBC: 4.39 x10E6/uL (ref 4.14–5.80)
RDW: 12.5 % (ref 12.3–15.4)
WBC: 5.3 10*3/uL (ref 3.4–10.8)

## 2018-08-18 LAB — TISSUE TRANSGLUTAMINASE, IGA: Transglutaminase IgA: <2 U/mL (ref 0–3)

## 2018-08-18 LAB — GLIADIN ANTIBODIES, SERUM
ANTIGLIADIN ABS, IGA: 5 U (ref 0–19)
GLIADIN IGG: 7 U (ref 0–19)

## 2018-08-18 LAB — THYROID PANEL WITH TSH
Free Thyroxine Index: 2.7 (ref 1.2–4.9)
T3 UPTAKE RATIO: 32 % (ref 24–39)
T4, Total: 8.5 ug/dL (ref 4.5–12.0)
TSH: 1.02 u[IU]/mL (ref 0.450–4.500)

## 2018-08-18 LAB — VITAMIN D 25 HYDROXY (VIT D DEFICIENCY, FRACTURES): Vit D, 25-Hydroxy: 29.3 ng/mL — ABNORMAL LOW (ref 30.0–100.0)

## 2018-08-19 ENCOUNTER — Encounter: Payer: Self-pay | Admitting: Family Medicine

## 2018-08-26 DIAGNOSIS — H6980 Other specified disorders of Eustachian tube, unspecified ear: Secondary | ICD-10-CM | POA: Diagnosis not present

## 2018-08-26 DIAGNOSIS — J301 Allergic rhinitis due to pollen: Secondary | ICD-10-CM | POA: Diagnosis not present

## 2018-08-26 DIAGNOSIS — H9313 Tinnitus, bilateral: Secondary | ICD-10-CM | POA: Diagnosis not present

## 2018-09-25 ENCOUNTER — Emergency Department
Admission: EM | Admit: 2018-09-25 | Discharge: 2018-09-25 | Disposition: A | Discharge status: Home / Self Care | Payer: 59 | Attending: Emergency Medicine | Admitting: Emergency Medicine

## 2018-09-25 ENCOUNTER — Other Ambulatory Visit: Payer: Self-pay

## 2018-09-25 DIAGNOSIS — Z23 Encounter for immunization: Secondary | ICD-10-CM | POA: Diagnosis not present

## 2018-09-25 DIAGNOSIS — Y999 Unspecified external cause status: Secondary | ICD-10-CM | POA: Diagnosis not present

## 2018-09-25 DIAGNOSIS — Y929 Unspecified place or not applicable: Secondary | ICD-10-CM | POA: Diagnosis not present

## 2018-09-25 DIAGNOSIS — Z203 Contact with and (suspected) exposure to rabies: Secondary | ICD-10-CM | POA: Diagnosis not present

## 2018-09-25 DIAGNOSIS — S61451A Open bite of right hand, initial encounter: Secondary | ICD-10-CM | POA: Insufficient documentation

## 2018-09-25 DIAGNOSIS — Y939 Activity, unspecified: Secondary | ICD-10-CM | POA: Insufficient documentation

## 2018-09-25 DIAGNOSIS — Z2914 Encounter for prophylactic rabies immune globin: Secondary | ICD-10-CM | POA: Insufficient documentation

## 2018-09-25 DIAGNOSIS — W5501XA Bitten by cat, initial encounter: Secondary | ICD-10-CM | POA: Diagnosis not present

## 2018-09-25 MED ORDER — RABIES IMMUNE GLOBULIN 150 UNIT/ML IM INJ
20.0000 [IU]/kg | INJECTION | Freq: Once | INTRAMUSCULAR | Status: AC
  Administered 2018-09-25: 1575 [IU] via INTRAMUSCULAR
  Filled 2018-09-25: qty 10.5

## 2018-09-25 MED ORDER — AMOXICILLIN-POT CLAVULANATE 875-125 MG PO TABS: 1.0000 | ORAL_TABLET | Freq: Two times a day (BID) | ORAL | 0 refills | Status: DC

## 2018-09-25 MED ORDER — RABIES VACCINE, PCEC IM SUSR
1.0000 mL | Freq: Once | INTRAMUSCULAR | Status: AC
  Administered 2018-09-25: 1 mL via INTRAMUSCULAR
  Filled 2018-09-25: qty 1

## 2018-09-25 NOTE — ED Provider Notes (Signed)
[facility] Emergency Department Provider Note  ____________________________________________  Time seen: Approximately 7:43 PM  I have reviewed the triage vital signs and the nursing notes.   HISTORY  Chief Complaint Animal Bite    HPI Kenneth Richard is a 32 y.o. male who presents the emergency department complaining of cat bite to his right hand.  Patient was holding a feral cat when he was accidentally bit on the dorsal aspect of the right hand.  Patient has 6 small puncture wounds to the lateral dorsal aspect of the hand.  Patient states that he thoroughly cleansed the area using rubbing alcohol, having applied triple antibiotic ointment as well.  Patient is concerned at this time about rabies and is requesting the rabies vaccination series.  Patient's last tetanus shot was a month ago at his regular yearly checkup.  Patient denies any other complaints.  He denies any erythema, edema, pain to the bite wounds.  He reports that they are healing well.    Past Medical History:  Diagnosis Date  . Anxiety   . Broken bones   . Depression     Patient Active Problem List   Diagnosis Date Noted  . 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 Anxiety 08/13/2018    Past Surgical History:  Procedure Laterality Date  . MOUTH SURGERY      Prior to Admission medications   Medication Sig Start Date End Date Taking? Authorizing Provider  amoxicillin-clavulanate (AUGMENTIN) 875-125 MG tablet Take 1 tablet by mouth 2 (two) times daily. 09/25/18   Vannie Hilgert, Charline Bills, PA-C  IBUPROFEN IB PO Take by mouth as needed.    [provider]    Allergies Patient has no known allergies.  Family History  Problem Relation Age of Onset  . Anxiety disorder Mother   . Depression Mother   . Anxiety disorder Father   . Depression Father   . Cowden syndrome Brother   . Breast cancer Maternal Grandmother   . Leukemia Maternal Grandfather   . Multiple myeloma Maternal  Grandfather   . Alzheimer's disease Paternal Grandfather     Social History Social History   Tobacco Use  . Smoking status: Never Smoker  . Smokeless tobacco: Never Used  Substance Use Topics  . Alcohol use: Yes    Alcohol/week: 8.0 standard drinks    Types: 8 Glasses of wine per week  . Drug use: Not Currently     Review of Systems  Constitutional: No fever/chills Eyes: No visual changes. No discharge ENT: No upper respiratory complaints. Cardiovascular: no chest pain. Respiratory: no cough. No SOB. Gastrointestinal: No abdominal pain.  No nausea, no vomiting.   Musculoskeletal: Negative for musculoskeletal pain. Skin: Cat bite to the right hand Neurological: Negative for headaches, focal weakness or numbness. 10-point ROS otherwise negative.  ____________________________________________   PHYSICAL EXAM:  VITAL SIGNS: ED Triage Vitals  Enc Vitals Group     BP 09/25/18 1908 125/78     Pulse Rate 09/25/18 1908 64     Resp 09/25/18 1908 18     Temp 09/25/18 1908 98.4 F (36.9 C)     Temp src --      SpO2 09/25/18 1908 99 %     Weight 09/25/18 1907 170 lb (77.1 kg)     Height 09/25/18 1907 _0  (1.727 m)     Head Circumference --      Peak Flow --      Pain Score 09/25/18 1907 0  Pain Loc --      Pain Edu? --      Excl. in [place]? --      Constitutional: Alert and oriented. Well appearing and in no acute distress. Eyes: Conjunctivae are normal. PERRL. EOMI. Head: Atraumatic. Neck: No stridor.    Cardiovascular: Normal rate, regular rhythm. Normal S1 and S2.  Good peripheral circulation. Respiratory: Normal respiratory effort without tachypnea or retractions. Lungs CTAB. Good air entry to the bases with no decreased or absent breath sounds. Musculoskeletal: Full range of motion to all extremities. No gross deformities appreciated. Neurologic:  Normal speech and language. No gross focal neurologic deficits are appreciated.  Skin:  Skin is warm, dry and  intact. No rash noted.  6 punctate lesions with well-formed scabs noted to the dorsal right hand.  Consistent with cat bite.  No surrounding erythema or edema.  No tenderness to palpation.  No visible foreign body.  Area appears to be healing well. Psychiatric: Mood and affect are normal. Speech and behavior are normal. Patient exhibits appropriate insight and judgement.   ____________________________________________   LABS (all labs ordered are listed, but only abnormal results are displayed)  Labs Reviewed - No data to display ____________________________________________  EKG   ____________________________________________  RADIOLOGY   No results found.  ____________________________________________    PROCEDURES  Procedure(s) performed:    Procedures    Medications  rabies vaccine (RABAVERT) injection 1 mL (1 mL Intramuscular Given 09/25/18 2042)  rabies immune globulin (HYPERAB/KEDRAB) injection 1,575 Units (1,575 Units Intramuscular Given 09/25/18 2040)     ____________________________________________   INITIAL IMPRESSION / ASSESSMENT AND PLAN / ED COURSE  Pertinent labs & imaging results that were available during my care of the patient were reviewed by me and considered in my medical decision making (see chart for details).  Review of the [place] CSRS was performed in accordance of the [place] prior to dispensing any controlled drugs.      Patient's diagnosis is consistent with cat bite, encounter for rabies vaccine.  Patient presents the emergency department after being bit by a feral cat 3 days prior.  Patient has cleansed the area well, no signs of infection at this time.  Given nature of wound, patient will be placed on antibiotics prophylactically however.  Patient is concerned about rabies given feral cat status, as such, rabies vaccination and immunoglobin is started today.  He will return on days 3, 7, 14 for further rabies vaccinations.  He understands  timeline and need for return.. Patient will be discharged home with prescriptions for Augmentin. Patient is to follow up with this department in 3 days or primary care as needed or otherwise directed. Patient is given ED precautions to return to the ED for any worsening or new symptoms.     ____________________________________________  FINAL CLINICAL IMPRESSION(S) / ED DIAGNOSES  Final diagnoses:  Need for post exposure prophylaxis for rabies  Cat bite, initial encounter      NEW MEDICATIONS STARTED DURING THIS VISIT:  ED Discharge Orders         Ordered    amoxicillin-clavulanate (AUGMENTIN) 875-125 MG tablet  2 times daily     09/25/18 2001              This chart was dictated using voice recognition software/Dragon. Despite best efforts to proofread, errors can occur which can change the meaning. Any change was purely unintentional.    Darletta Moll, PA-C 09/25/18 2045    Nance Pear, MD 09/25/18 2110

## 2018-09-25 NOTE — ED Notes (Signed)
Pt to ED reporting he was bitten by a kitten three days ago and is now concerned about rabies. No symptoms and no pain in right hand where bite is. Bite appears to be healing appropriately  No redness, drainage, or pain.

## 2018-09-25 NOTE — ED Triage Notes (Signed)
Pt comes via POV from home with c/o animal bite. Pt states this occurred Wednesday around 5pm.   Pt states it was a ferrel kitten and it got trapped. Pt tried to assist and got his right hand biten.

## 2018-09-28 ENCOUNTER — Emergency Department
Admission: EM | Admit: 2018-09-28 | Discharge: 2018-09-28 | Disposition: A | Discharge status: Home / Self Care | Payer: 59 | Attending: Emergency Medicine | Admitting: Emergency Medicine

## 2018-09-28 ENCOUNTER — Other Ambulatory Visit: Payer: Self-pay

## 2018-09-28 ENCOUNTER — Encounter: Payer: Self-pay | Admitting: *Deleted

## 2018-09-28 DIAGNOSIS — Z23 Encounter for immunization: Secondary | ICD-10-CM | POA: Diagnosis not present

## 2018-09-28 DIAGNOSIS — Z203 Contact with and (suspected) exposure to rabies: Secondary | ICD-10-CM | POA: Diagnosis not present

## 2018-09-28 MED ORDER — RABIES VACCINE, PCEC IM SUSR
1.0000 mL | Freq: Once | INTRAMUSCULAR | Status: AC
  Administered 2018-09-28: 1 mL via INTRAMUSCULAR
  Filled 2018-09-28: qty 1

## 2018-09-28 NOTE — ED Triage Notes (Signed)
Pt to ED needing second rabies vaccine. No complaints or symptoms. Bite to right hand healing appropriately.

## 2018-09-28 NOTE — ED Provider Notes (Signed)
[facility] Emergency Department Provider Note  ____________________________________________  Time seen: Approximately 8:28 PM  I have reviewed the triage vital signs and the nursing notes.   HISTORY  Chief Complaint Rabies Injection   HPI Kenneth Richard is a 32 y.o. male who presents to the emergency department for second rabies vaccination in the series.  He had a bite to his right hand from a feral cat on Saturday.  No issues with the wound.  No pain in the hand or other concerns.  He had no issues with the initial rabies series.  Past Medical History:  Diagnosis Date  . Anxiety   . Broken bones   . Depression     Patient Active Problem List   Diagnosis Date Noted  . 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 Anxiety 08/13/2018    Past Surgical History:  Procedure Laterality Date  . MOUTH SURGERY      Prior to Admission medications   Medication Sig Start Date End Date Taking? Authorizing Provider  amoxicillin-clavulanate (AUGMENTIN) 875-125 MG tablet Take 1 tablet by mouth 2 (two) times daily. 09/25/18   Cuthriell, Jonathan D, PA-C  IBUPROFEN IB PO Take by mouth as needed.    [provider]    Allergies Patient has no known allergies.  Family History  Problem Relation Age of Onset  . Anxiety disorder Mother   . Depression Mother   . Anxiety disorder Father   . Depression Father   . Cowden syndrome Brother   . Breast cancer Maternal Grandmother   . Leukemia Maternal Grandfather   . Multiple myeloma Maternal Grandfather   . Alzheimer's disease Paternal Grandfather     Social History Social History   Tobacco Use  . Smoking status: Never Smoker  . Smokeless tobacco: Never Used  Substance Use Topics  . Alcohol use: Yes    Alcohol/week: 8.0 standard drinks    Types: 8 Glasses of wine per week  . Drug use: Not Currently    Review of Systems Constitutional: No fever/chills Eyes: No visual changes. Respiratory: Denies  shortness of breath. Musculoskeletal: Negative for pain. Skin: Negative for concern of infection. Neurological: Negative for headaches, focal weakness or numbness. ____________________________________________   PHYSICAL EXAM:  VITAL SIGNS: ED Triage Vitals  Enc Vitals Group     BP 09/28/18 1924 118/62     Pulse Rate 09/28/18 1924 (!) 56     Resp 09/28/18 1924 20     Temp 09/28/18 1924 98.1 F (36.7 C)     Temp Source 09/28/18 1924 Oral     SpO2 09/28/18 1924 99 %     Weight 09/28/18 1916 170 lb (77.1 kg)     Height --      Head Circumference --      Peak Flow --      Pain Score 09/28/18 1916 0     Pain Loc --      Pain Edu? --      Excl. in [place]? --     Constitutional: Alert and oriented. Well appearing and in no acute distress. Eyes: Conjunctivae are normal. PERRL. EOMI. Head: Atraumatic. Nose: No congestion/rhinnorhea. Mouth/Throat: Mucous membranes are moist.  Oropharynx non-erythematous. Neck: No stridor.  Cardiovascular: Normal rate, regular rhythm.  Respiratory: Normal respiratory effort.  No retractions.. Gastrointestinal: Soft and nontender. No distention Musculoskeletal: No lower extremity tenderness nor edema.  No joint effusions. Neurologic:  Normal speech and language. No gross focal neurologic deficits are appreciated. Speech is normal. No   gait instability. Skin:  Skin is warm, dry and intact.  Psychiatric: Mood and affect are normal. Speech and behavior are normal.  ____________________________________________   LABS (all labs ordered are listed, but only abnormal results are displayed)  Labs Reviewed - No data to display ____________________________________________   RADIOLOGY  Not indicated ____________________________________________   PROCEDURES  Procedure(s) performed: None  ____________________________________________   INITIAL IMPRESSION / ASSESSMENT AND PLAN / ED COURSE     Pertinent labs & imaging results that were available  during my care of the patient were reviewed by me and considered in my medical decision making (see chart for details).  32 year old male presenting to the emergency department for second rabies vaccination in the series.  Patient states that he will return on Saturday for the third vaccination.  He was encouraged to return sooner for any symptoms of concern. ____________________________________________   FINAL CLINICAL IMPRESSION(S) / ED DIAGNOSES  Final diagnoses:  Encounter for repeat administration of rabies vaccination    Note:  This document was prepared using Dragon voice recognition software and may include unintentional dictation errors.    Victorino Dike, FNP 09/28/18 2031    Rudene Re, MD 09/30/18 [phone]

## 2018-10-02 ENCOUNTER — Encounter: Payer: Self-pay | Admitting: Emergency Medicine

## 2018-10-02 ENCOUNTER — Emergency Department
Admission: EM | Admit: 2018-10-02 | Discharge: 2018-10-02 | Disposition: A | Discharge status: Home / Self Care | Payer: 59 | Attending: Emergency Medicine | Admitting: Emergency Medicine

## 2018-10-02 ENCOUNTER — Other Ambulatory Visit: Payer: Self-pay

## 2018-10-02 DIAGNOSIS — Z203 Contact with and (suspected) exposure to rabies: Secondary | ICD-10-CM | POA: Insufficient documentation

## 2018-10-02 DIAGNOSIS — Z23 Encounter for immunization: Secondary | ICD-10-CM

## 2018-10-02 MED ORDER — RABIES VACCINE, PCEC IM SUSR
1.0000 mL | Freq: Once | INTRAMUSCULAR | Status: AC
  Administered 2018-10-02: 1 mL via INTRAMUSCULAR
  Filled 2018-10-02: qty 1

## 2018-10-02 NOTE — Discharge Instructions (Signed)
Please return for the final injection next week.

## 2018-10-02 NOTE — ED Notes (Signed)
Pt presents to ED for third series of rabies injection shot.

## 2018-10-02 NOTE — ED Triage Notes (Signed)
Rabies series.

## 2018-10-09 ENCOUNTER — Emergency Department
Admission: EM | Admit: 2018-10-09 | Discharge: 2018-10-09 | Disposition: A | Discharge status: Home / Self Care | Payer: 59 | Attending: Emergency Medicine | Admitting: Emergency Medicine

## 2018-10-09 ENCOUNTER — Other Ambulatory Visit: Payer: Self-pay

## 2018-10-09 DIAGNOSIS — F329 Major depressive disorder, single episode, unspecified: Secondary | ICD-10-CM | POA: Diagnosis not present

## 2018-10-09 DIAGNOSIS — Z23 Encounter for immunization: Secondary | ICD-10-CM | POA: Insufficient documentation

## 2018-10-09 DIAGNOSIS — F419 Anxiety disorder, unspecified: Secondary | ICD-10-CM | POA: Diagnosis not present

## 2018-10-09 DIAGNOSIS — Z203 Contact with and (suspected) exposure to rabies: Secondary | ICD-10-CM | POA: Diagnosis not present

## 2018-10-09 MED ORDER — RABIES VACCINE, PCEC IM SUSR
1.0000 mL | Freq: Once | INTRAMUSCULAR | Status: AC
  Administered 2018-10-09: 1 mL via INTRAMUSCULAR
  Filled 2018-10-09: qty 1

## 2018-10-09 NOTE — ED Triage Notes (Signed)
Here for last rabies series. A&O, ambulatory.

## 2018-10-09 NOTE — ED Provider Notes (Signed)
[facility] Emergency Department Provider Note  ____________________________________________  Time seen: Approximately 7:28 AM  I have reviewed the triage vital signs and the nursing notes.   HISTORY  Chief Complaint Rabies Injection    HPI Kenneth Richard is a 32 y.o. male presents emergency department for 4th and final rabies vaccination.  He received previous vaccinations on 11/30, 12/3, 12/7.  He was bitten by a feral cat 2 weeks ago.  He has had no problems with the wound.  He has had no questions or concerns with the rabies vaccination series.  Past Medical History:  Diagnosis Date  . Anxiety   . Broken bones   . Depression     Patient Active Problem List   Diagnosis Date Noted  . 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 Anxiety 08/13/2018    Past Surgical History:  Procedure Laterality Date  . MOUTH SURGERY      Prior to Admission medications   Medication Sig Start Date End Date Taking? Authorizing Provider  amoxicillin-clavulanate (AUGMENTIN) 875-125 MG tablet Take 1 tablet by mouth 2 (two) times daily. 09/25/18   Cuthriell, Charline Bills, PA-C  IBUPROFEN IB PO Take by mouth as needed.    [provider]    Allergies Patient has no known allergies.  Family History  Problem Relation Age of Onset  . Anxiety disorder Mother   . Depression Mother   . Anxiety disorder Father   . Depression Father   . Cowden syndrome Brother   . Breast cancer Maternal Grandmother   . Leukemia Maternal Grandfather   . Multiple myeloma Maternal Grandfather   . Alzheimer's disease Paternal Grandfather     Social History Social History   Tobacco Use  . Smoking status: Never Smoker  . Smokeless tobacco: Never Used  Substance Use Topics  . Alcohol use: Yes    Alcohol/week: 8.0 standard drinks    Types: 8 Glasses of wine per week  . Drug use: Not Currently     Review of Systems  Cardiovascular: No chest pain. Respiratory: No  SOB. Gastrointestinal:  No nausea, no vomiting.  Musculoskeletal: Negative for musculoskeletal pain. Skin: Negative for ecchymosis.   ____________________________________________   PHYSICAL EXAM:  VITAL SIGNS: ED Triage Vitals  Enc Vitals Group     BP 10/09/18 0703 (!) 120/56     Pulse Rate 10/09/18 0703 62     Resp 10/09/18 0703 16     Temp 10/09/18 0703 (!) 97.5 F (36.4 C)     Temp Source 10/09/18 0703 Oral     SpO2 10/09/18 0703 98 %     Weight 10/09/18 0704 169 lb 12.1 oz (77 kg)     Height 10/09/18 0704 '5\' 9"'$  (1.753 m)     Head Circumference --      Peak Flow --      Pain Score 10/09/18 0703 0     Pain Loc --      Pain Edu? --      Excl. in [place]? --      Constitutional: Alert and oriented. Well appearing and in no acute distress. Eyes: Conjunctivae are normal. PERRL. EOMI. Head: Atraumatic. ENT:      Ears:      Nose: No congestion/rhinnorhea.      Mouth/Throat: Mucous membranes are moist.  Neck: No stridor.   Cardiovascular: Normal rate, regular rhythm.  Good peripheral circulation. Respiratory: Normal respiratory effort without tachypnea or retractions. Lungs CTAB. Good air entry to the bases with no  decreased or absent breath sounds. Musculoskeletal: Full range of motion to all extremities. No gross deformities appreciated. Neurologic:  Normal speech and language. No gross focal neurologic deficits are appreciated.  Skin:  Skin is warm, dry. No rash noted. Psychiatric: Mood and affect are normal. Speech and behavior are normal. Patient exhibits appropriate insight and judgement.   ____________________________________________   LABS (all labs ordered are listed, but only abnormal results are displayed)  Labs Reviewed - No data to display ____________________________________________  EKG   ____________________________________________  RADIOLOGY   No results found.  ____________________________________________    PROCEDURES  Procedure(s)  performed:    Procedures    Medications  rabies vaccine (RABAVERT) injection 1 mL (1 mL Intramuscular Given 10/09/18 0756)     ____________________________________________   INITIAL IMPRESSION / ASSESSMENT AND PLAN / ED COURSE  Pertinent labs & imaging results that were available during my care of the patient were reviewed by me and considered in my medical decision making (see chart for details).  Review of the [place] CSRS was performed in accordance of the [place] prior to dispensing any controlled drugs.     Patient presented to the emergency department for her last rabies vaccination.  Vital signs and exam are reassuring.  Vaccination was administered by Therapist, sports.  He has had no difficulties with previous vaccinations. Patient is to follow up with PCP as directed. Patient is given ED precautions to return to the ED for any worsening or new symptoms.     ____________________________________________  FINAL CLINICAL IMPRESSION(S) / ED DIAGNOSES  Final diagnoses:  Encounter for repeat administration of rabies vaccination      NEW MEDICATIONS STARTED DURING THIS VISIT:  ED Discharge Orders    None          This chart was dictated using voice recognition software/Dragon. Despite best efforts to proofread, errors can occur which can change the meaning. Any change was purely unintentional.    Laban Emperor, PA-C 10/09/18 [id number]    Lisa Roca, MD 10/09/18 [phone]

## 2018-10-11 ENCOUNTER — Encounter: Payer: Self-pay | Admitting: Family Medicine

## 2018-10-11 ENCOUNTER — Ambulatory Visit (INDEPENDENT_AMBULATORY_CARE_PROVIDER_SITE_OTHER): Payer: 59 | Admitting: Family Medicine

## 2018-10-11 VITALS — BP 115/71 | HR 59 | Temp 98.3°F | Ht 67.5 in | Wt 169.6 lb

## 2018-10-11 DIAGNOSIS — Z0000 Encounter for general adult medical examination without abnormal findings: Secondary | ICD-10-CM | POA: Diagnosis not present

## 2018-10-11 DIAGNOSIS — Z23 Encounter for immunization: Secondary | ICD-10-CM

## 2018-10-11 DIAGNOSIS — Z20828 Contact with and (suspected) exposure to other viral communicable diseases: Secondary | ICD-10-CM

## 2018-10-11 NOTE — Patient Instructions (Addendum)
Health Maintenance, Male A healthy lifestyle and preventive care is important for your health and wellness. Ask your health care provider about what schedule of regular examinations is right for you. What should I know about weight and diet? Eat a Healthy Diet  Eat plenty of vegetables, fruits, whole grains, low-fat dairy products, and lean protein.  Do not eat a lot of foods high in solid fats, added sugars, or salt.  Maintain a Healthy Weight Regular exercise can help you achieve or maintain a healthy weight. You should:  Do at least 150 minutes of exercise each week. The exercise should increase your heart rate and make you sweat (moderate-intensity exercise).  Do strength-training exercises at least twice a week.  Watch Your Levels of Cholesterol and Blood Lipids  Have your blood tested for lipids and cholesterol every 5 years starting at 32 years of age. If you are at high risk for heart disease, you should start having your blood tested when you are 32 years old. You may need to have your cholesterol levels checked more often if: ? Your lipid or cholesterol levels are high. ? You are older than 32 years of age. ? You are at high risk for heart disease.  What should I know about cancer screening? Many types of cancers can be detected early and may often be prevented. Lung Cancer  You should be screened every year for lung cancer if: ? You are a current smoker who has smoked for at least 30 years. ? You are a former smoker who has quit within the past 15 years.  Talk to your health care provider about your screening options, when you should start screening, and how often you should be screened.  Colorectal Cancer  Routine colorectal cancer screening usually begins at 32 years of age and should be repeated every 5-10 years until you are 32 years old. You may need to be screened more often if early forms of precancerous polyps or small growths are found. Your health care provider  may recommend screening at an earlier age if you have risk factors for colon cancer.  Your health care provider may recommend using home test kits to check for hidden blood in the stool.  A small camera at the end of a tube can be used to examine your colon (sigmoidoscopy or colonoscopy). This checks for the earliest forms of colorectal cancer.  Prostate and Testicular Cancer  Depending on your age and overall health, your health care provider may do certain tests to screen for prostate and testicular cancer.  Talk to your health care provider about any symptoms or concerns you have about testicular or prostate cancer.  Skin Cancer  Check your skin from head to toe regularly.  Tell your health care provider about any new moles or changes in moles, especially if: ? There is a change in a mole's size, shape, or color. ? You have a mole that is larger than a pencil eraser.  Always use sunscreen. Apply sunscreen liberally and repeat throughout the day.  Protect yourself by wearing long sleeves, pants, a wide-brimmed hat, and sunglasses when outside.  What should I know about heart disease, diabetes, and high blood pressure?  If you are 18-39 years of age, have your blood pressure checked every 3-5 years. If you are 40 years of age or older, have your blood pressure checked every year. You should have your blood pressure measured twice-once when you are at a hospital or clinic, and once when   you are not at a hospital or clinic. Record the average of the two measurements. To check your blood pressure when you are not at a hospital or clinic, you can use: ? An automated blood pressure machine at a pharmacy. ? A home blood pressure monitor.  Talk to your health care provider about your target blood pressure.  If you are between 45-79 years old, ask your health care provider if you should take aspirin to prevent heart disease.  Have regular diabetes screenings by checking your fasting blood  sugar level. ? If you are at a normal weight and have a low risk for diabetes, have this test once every three years after the age of 45. ? If you are overweight and have a high risk for diabetes, consider being tested at a younger age or more often.  A one-time screening for abdominal aortic aneurysm (AAA) by ultrasound is recommended for men aged 65-75 years who are current or former smokers. What should I know about preventing infection? Hepatitis B If you have a higher risk for hepatitis B, you should be screened for this virus. Talk with your health care provider to find out if you are at risk for hepatitis B infection. Hepatitis C Blood testing is recommended for:  Everyone born from 1945 through 1965.  Anyone with known risk factors for hepatitis C.  Sexually Transmitted Diseases (STDs)  You should be screened each year for STDs including gonorrhea and chlamydia if: ? You are sexually active and are younger than 32 years of age. ? You are older than 32 years of age and your health care provider tells you that you are at risk for this type of infection. ? Your sexual activity has changed since you were last screened and you are at an increased risk for chlamydia or gonorrhea. Ask your health care provider if you are at risk.  Talk with your health care provider about whether you are at high risk of being infected with HIV. Your health care provider may recommend a prescription medicine to help prevent HIV infection.  What else can I do?  Schedule regular health, dental, and eye exams.  Stay current with your vaccines (immunizations).  Do not use any tobacco products, such as cigarettes, chewing tobacco, and e-cigarettes. If you need help quitting, ask your health care provider.  Limit alcohol intake to no more than 2 drinks per day. One drink equals 12 ounces of beer, 5 ounces of wine, or 1 ounces of hard liquor.  Do not use street drugs.  Do not share needles.  Ask your  health care provider for help if you need support or information about quitting drugs.  Tell your health care provider if you often feel depressed.  Tell your health care provider if you have ever been abused or do not feel safe at home. This information is not intended to replace advice given to you by your health care provider. Make sure you discuss any questions you have with your health care provider. Document Released: 04/10/2008 Document Revised: 06/11/2016 Document Reviewed: 07/17/2015 Elsevier Interactive Patient Education  2018 [employer] Influenza (Flu) Vaccine (Inactivated or Recombinant): What You Need to Know 1. Why get vaccinated? Influenza ("flu") is a contagious disease that spreads around the [place] every year, usually between October and May. Flu is caused by influenza viruses, and is spread mainly by coughing, sneezing, and close contact. Anyone can get flu. Flu strikes suddenly and can last several days. Symptoms vary by   age, but can include:  fever/chills  sore throat  muscle aches  fatigue  cough  headache  runny or stuffy nose  Flu can also lead to pneumonia and blood infections, and cause diarrhea and seizures in children. If you have a medical condition, such as heart or lung disease, flu can make it worse. Flu is more dangerous for some people. Infants and young children, people 65 years of age and older, pregnant women, and people with certain health conditions or a weakened immune system are at greatest risk. Each year thousands of people in the [place] die from flu, and many more are hospitalized. Flu vaccine can:  keep you from getting flu,  make flu less severe if you do get it, and  keep you from spreading flu to your family and other people. 2. Inactivated and recombinant flu vaccines A dose of flu vaccine is recommended every flu season. Children 6 months through 8 years of age may need two doses during the same flu season.  Everyone else needs only one dose each flu season. Some inactivated flu vaccines contain a very small amount of a mercury-based preservative called thimerosal. Studies have not shown thimerosal in vaccines to be harmful, but flu vaccines that do not contain thimerosal are available. There is no live flu virus in flu shots. They cannot cause the flu. There are many flu viruses, and they are always changing. Each year a new flu vaccine is made to protect against three or four viruses that are likely to cause disease in the upcoming flu season. But even when the vaccine doesn't exactly match these viruses, it may still provide some protection. Flu vaccine cannot prevent:  flu that is caused by a virus not covered by the vaccine, or  illnesses that look like flu but are not.  It takes about 2 weeks for protection to develop after vaccination, and protection lasts through the flu season. 3. Some people should not get this vaccine Tell the person who is giving you the vaccine:  If you have any severe, life-threatening allergies. If you ever had a life-threatening allergic reaction after a dose of flu vaccine, or have a severe allergy to any part of this vaccine, you may be advised not to get vaccinated. Most, but not all, types of flu vaccine contain a small amount of egg protein.  If you ever had Guillain-Barr Syndrome (also called GBS). Some people with a history of GBS should not get this vaccine. This should be discussed with your doctor.  If you are not feeling well. It is usually okay to get flu vaccine when you have a mild illness, but you might be asked to come back when you feel better.  4. Risks of a vaccine reaction With any medicine, including vaccines, there is a chance of reactions. These are usually mild and go away on their own, but serious reactions are also possible. Most people who get a flu shot do not have any problems with it. Minor problems following a flu shot  include:  soreness, redness, or swelling where the shot was given  hoarseness  sore, red or itchy eyes  cough  fever  aches  headache  itching  fatigue  If these problems occur, they usually begin soon after the shot and last 1 or 2 days. More serious problems following a flu shot can include the following:  There may be a small increased risk of Guillain-Barre Syndrome (GBS) after inactivated flu vaccine. This risk has   been estimated at 1 or 2 additional cases per million people vaccinated. This is much lower than the risk of severe complications from flu, which can be prevented by flu vaccine.  Young children who get the flu shot along with pneumococcal vaccine (PCV13) and/or DTaP vaccine at the same time might be slightly more likely to have a seizure caused by fever. Ask your doctor for more information. Tell your doctor if a child who is getting flu vaccine has ever had a seizure.  Problems that could happen after any injected vaccine:  People sometimes faint after a medical procedure, including vaccination. Sitting or lying down for about 15 minutes can help prevent fainting, and injuries caused by a fall. Tell your doctor if you feel dizzy, or have vision changes or ringing in the ears.  Some people get severe pain in the shoulder and have difficulty moving the arm where a shot was given. This happens very rarely.  Any medication can cause a severe allergic reaction. Such reactions from a vaccine are very rare, estimated at about 1 in a million doses, and would happen within a few minutes to a few hours after the vaccination. As with any medicine, there is a very remote chance of a vaccine causing a serious injury or death. The safety of vaccines is always being monitored. For more information, visit: www.cdc.gov/vaccinesafety/ 5. What if there is a serious reaction? What should I look for? Look for anything that concerns you, such as signs of a severe allergic reaction,  very high fever, or unusual behavior. Signs of a severe allergic reaction can include hives, swelling of the face and throat, difficulty breathing, a fast heartbeat, dizziness, and weakness. These would start a few minutes to a few hours after the vaccination. What should I do?  If you think it is a severe allergic reaction or other emergency that can't wait, call 9-1-1 and get the person to the nearest hospital. Otherwise, call your doctor.  Reactions should be reported to the Vaccine Adverse Event Reporting System (VAERS). Your doctor should file this report, or you can do it yourself through the VAERS web site at www.vaers.hhs.gov, or by calling 1-800-822-7967. ? VAERS does not give medical advice. 6. The National Vaccine Injury Compensation Program The National Vaccine Injury Compensation Program (VICP) is a federal program that was created to compensate people who may have been injured by certain vaccines. Persons who believe they may have been injured by a vaccine can learn about the program and about filing a claim by calling 1-800-338-2382 or visiting the VICP website at www.hrsa.gov/vaccinecompensation. There is a time limit to file a claim for compensation. 7. How can I learn more?  Ask your healthcare provider. He or she can give you the vaccine package insert or suggest other sources of information.  Call your local or state health department.  Contact the Centers for Disease Control and Prevention (CDC): ? Call 1-800-232-4636 (1-800-CDC-INFO) or ? Visit CDC's website at www.cdc.gov/flu Vaccine Information Statement, Inactivated Influenza Vaccine (06/02/2014) This information is not intended to replace advice given to you by your health care provider. Make sure you discuss any questions you have with your health care provider. Document Released: 08/07/2006 Document Revised: 07/03/2016 Document Reviewed: 07/03/2016 Elsevier Interactive Patient Education  2017 [employer]  

## 2018-10-11 NOTE — Progress Notes (Signed)
BP 115/71   Pulse (!) 59   Temp 98.3 F (36.8 C) (Oral)   Ht 5' 7.5" (1.715 m)   Wt 169 lb 9.6 oz (76.9 kg)   SpO2 97%   BMI 26.17 kg/m    Subjective:    Patient ID: Kenneth Richard, male    DOB: [birth year], 32 y.o.   MRN: [id number]  HPI: Kenneth Richard is a 32 y.o. male presenting on 10/11/2018 for comprehensive medical examination. Current medical complaints include: fell onto his bottom off a scaffold about a week ago- able to sit, doing much better. Has been sitting on a donut. Otherwise doing well with no other concerns or complaints at this time.   He currently lives with: wife Interim Problems from his last visit: got bit by a cat and had to get rabies vaccines  Depression Screen done today and results listed below:  Depression screen [facility] 2/9 10/11/2018 08/13/2018  Decreased Interest 0 0  Down, Depressed, Hopeless 0 0  PHQ - 2 Score 0 0  Altered sleeping 0 1  Tired, decreased energy 0 0  Change in appetite 0 0  Feeling bad or failure about yourself  0 0  Trouble concentrating 0 2  Moving slowly or fidgety/restless 0 2  Suicidal thoughts 0 0  PHQ-9 Score 0 5  Difficult doing work/chores - Not difficult at all    Past Medical History:  Past Medical History:  Diagnosis Date  . Anxiety   . Broken bones   . Depression     Surgical History:  Past Surgical History:  Procedure Laterality Date  . MOUTH SURGERY      Medications:  Current Outpatient Medications on File Prior to Visit  Medication Sig  . fluticasone (FLONASE) 50 MCG/ACT nasal spray U 2 SPRAYS  IEN ONCE D  . IBUPROFEN IB PO Take by mouth as needed.   No current facility-administered medications on file prior to visit.     Allergies:  No Known Allergies  Social History:  Social History   Socioeconomic History  . Marital status: Married    Spouse name: Not on file  . Number of children: Not on file  . Years of education: Not on file  . Highest education level: Not on file  Occupational  History  . Not on file  Social Needs  . Financial resource strain: Not on file  . Food insecurity:    Worry: Not on file    Inability: Not on file  . Transportation needs:    Medical: Not on file    Non-medical: Not on file  Tobacco Use  . Smoking status: Never Smoker  . Smokeless tobacco: Never Used  Substance and Sexual Activity  . Alcohol use: Yes    Alcohol/week: 8.0 standard drinks    Types: 8 Glasses of wine per week  . Drug use: Not Currently  . Sexual activity: Yes  Lifestyle  . Physical activity:    Days per week: Not on file    Minutes per session: Not on file  . Stress: Not on file  Relationships  . Social connections:    Talks on phone: Not on file    Gets together: Not on file    Attends religious service: Not on file    Active member of club or organization: Not on file    Attends meetings of clubs or organizations: Not on file    Relationship status: Not on file  . Intimate partner violence:    Fear  of current or ex partner: Not on file    Emotionally abused: Not on file    Physically abused: Not on file    Forced sexual activity: Not on file  Other Topics Concern  . Not on file  Social History Narrative  . Not on file   Social History   Tobacco Use  Smoking Status Never Smoker  Smokeless Tobacco Never Used   Social History   Substance and Sexual Activity  Alcohol Use Yes  . Alcohol/week: 8.0 standard drinks  . Types: 8 Glasses of wine per week    Family History:  Family History  Problem Relation Age of Onset  . Anxiety disorder Mother   . Depression Mother   . Anxiety disorder Father   . Depression Father   . Cowden syndrome Brother   . Breast cancer Maternal Grandmother   . Leukemia Maternal Grandfather   . Multiple myeloma Maternal Grandfather   . Alzheimer's disease Paternal Grandfather   . Parkinson's disease Paternal Uncle     Past medical history, surgical history, medications, allergies, family history and social history  reviewed with patient today and changes made to appropriate areas of the chart.   Review of Systems  Constitutional: Negative.   HENT: Negative.        Saw ENT- had ETD   Eyes: Negative.   Respiratory: Positive for cough (x2-3 weeks, then seems to have gotten better). Negative for hemoptysis, sputum production, shortness of breath and wheezing.   Cardiovascular: Negative.   Gastrointestinal: Positive for diarrhea (comes and goes- has been doing a bit better by adding in a bit more animal protein). Negative for abdominal pain, blood in stool, constipation, heartburn, melena, nausea and vomiting.  Genitourinary: Negative.   Musculoskeletal: Positive for back pain, falls and myalgias. Negative for joint pain and neck pain.  Skin: Negative.   Neurological: Negative.   Endo/Heme/Allergies: Negative for environmental allergies and polydipsia. Does not bruise/bleed easily.  Psychiatric/Behavioral: Negative.     All other ROS negative except what is listed above and in the HPI.      Objective:    BP 115/71   Pulse (!) 59   Temp 98.3 F (36.8 C) (Oral)   Ht 5' 7.5" (1.715 m)   Wt 169 lb 9.6 oz (76.9 kg)   SpO2 97%   BMI 26.17 kg/m   Wt Readings from Last 3 Encounters:  10/11/18 169 lb 9.6 oz (76.9 kg)  10/09/18 169 lb 12.1 oz (77 kg)  10/02/18 170 lb (77.1 kg)    Physical Exam Vitals signs and nursing note reviewed.  Constitutional:      General: He is not in acute distress.    Appearance: Normal appearance. He is well-developed and normal weight. He is not ill-appearing, toxic-appearing or diaphoretic.  HENT:     Head: Normocephalic and atraumatic.     Right Ear: Hearing, tympanic membrane, ear canal and external ear normal. There is no impacted cerumen.     Left Ear: Hearing, tympanic membrane, ear canal and external ear normal. There is no impacted cerumen.     Nose: Nose normal. No congestion or rhinorrhea.     Mouth/Throat:     Mouth: Mucous membranes are moist.      Pharynx: Oropharynx is clear. No oropharyngeal exudate or posterior oropharyngeal erythema.  Eyes:     General: Lids are normal. No scleral icterus.       Right eye: No discharge.        Left eye:  No discharge.     Extraocular Movements: Extraocular movements intact.     Conjunctiva/sclera: Conjunctivae normal.     Pupils: Pupils are equal, round, and reactive to light.  Neck:     Musculoskeletal: Normal range of motion and neck supple. No neck rigidity or muscular tenderness.     Vascular: No carotid bruit.  Cardiovascular:     Rate and Rhythm: Normal rate and regular rhythm.     Pulses: Normal pulses.     Heart sounds: Normal heart sounds. No murmur. No friction rub. No gallop.   Pulmonary:     Effort: Pulmonary effort is normal. No respiratory distress.     Breath sounds: Normal breath sounds. No stridor. No wheezing, rhonchi or rales.  Chest:     Chest wall: No tenderness.  Abdominal:     General: Abdomen is flat. Bowel sounds are normal. There is no distension.     Palpations: Abdomen is soft. There is no mass.     Tenderness: There is no abdominal tenderness. There is no right CVA tenderness, left CVA tenderness, guarding or rebound.     Hernia: No hernia is present.  Genitourinary:    Comments: Genital exam deferred with shared decision making Musculoskeletal: Normal range of motion.        General: No swelling, tenderness, deformity or signs of injury.     Right lower leg: No edema.     Left lower leg: No edema.  Lymphadenopathy:     Cervical: No cervical adenopathy.  Skin:    General: Skin is warm and dry.     Capillary Refill: Capillary refill takes less than 2 seconds.     Coloration: Skin is not jaundiced or pale.     Findings: No bruising, erythema, lesion or rash.  Neurological:     General: No focal deficit present.     Mental Status: He is alert and oriented to person, place, and time. Mental status is at baseline.     Cranial Nerves: No cranial nerve deficit.      Sensory: No sensory deficit.     Motor: No weakness.     Coordination: Coordination normal.     Gait: Gait normal.     Deep Tendon Reflexes: Reflexes normal.  Psychiatric:        Mood and Affect: Mood normal.        Speech: Speech normal.        Behavior: Behavior normal.        Thought Content: Thought content normal.        Judgment: Judgment normal.     Results for orders placed or performed in visit on 08/13/18  CBC with Differential/Platelet  Result Value Ref Range   WBC 5.3 3.4 - 10.8 x10E3/uL   RBC 4.39 4.14 - 5.80 x10E6/uL   Hemoglobin 14.0 13.0 - 17.7 g/dL   Hematocrit 40.3 37.5 - 51.0 %   MCV 92 79 - 97 fL   MCH 31.9 26.6 - 33.0 pg   MCHC 34.7 31.5 - 35.7 g/dL   RDW 12.5 12.3 - 15.4 %   Platelets 282 150 - 450 x10E3/uL   Neutrophils 59 Not Estab. %   Lymphs 30 Not Estab. %   Monocytes 8 Not Estab. %   Eos 2 Not Estab. %   Basos 1 Not Estab. %   Neutrophils Absolute 3.2 1.4 - 7.0 x10E3/uL   Lymphocytes Absolute 1.6 0.7 - 3.1 x10E3/uL   Monocytes Absolute 0.4 0.1 - 0.9 x10E3/uL  EOS (ABSOLUTE) 0.1 0.0 - 0.4 x10E3/uL   Basophils Absolute 0.1 0.0 - 0.2 x10E3/uL   Immature Granulocytes 0 Not Estab. %   Immature Grans (Abs) 0.0 0.0 - 0.1 x10E3/uL  Comprehensive metabolic panel  Result Value Ref Range   Glucose 87 65 - 99 mg/dL   BUN 12 6 - 20 mg/dL   Creatinine, Ser 0.72 (L) 0.76 - 1.27 mg/dL   GFR calc non Af Amer 123 >59 mL/min/1.73   GFR calc Af Amer 143 >59 mL/min/1.73   BUN/Creatinine Ratio 17 9 - 20   Sodium 141 134 - 144 mmol/L   Potassium 4.2 3.5 - 5.2 mmol/L   Chloride 103 96 - 106 mmol/L   CO2 25 20 - 29 mmol/L   Calcium 9.0 8.7 - 10.2 mg/dL   Total Protein 6.5 6.0 - 8.5 g/dL   Albumin 4.6 3.5 - 5.5 g/dL   Globulin, Total 1.9 1.5 - 4.5 g/dL   Albumin/Globulin Ratio 2.4 (H) 1.2 - 2.2   Bilirubin Total 0.6 0.0 - 1.2 mg/dL   Alkaline Phosphatase 55 39 - 117 IU/L   AST 12 0 - 40 IU/L   ALT 12 0 - 44 IU/L  Lipid Panel w/o Chol/HDL Ratio    Result Value Ref Range   Cholesterol, Total 131 100 - 199 mg/dL   Triglycerides 78 0 - 149 mg/dL   HDL 56 >39 mg/dL   VLDL Cholesterol Cal 16 5 - 40 mg/dL   LDL Calculated 59 0 - 99 mg/dL  Thyroid Panel With TSH  Result Value Ref Range   TSH 1.020 0.450 - 4.500 uIU/mL   T4, Total 8.5 4.5 - 12.0 ug/dL   T3 Uptake Ratio 32 24 - 39 %   Free Thyroxine Index 2.7 1.2 - 4.9  Gliadin antibodies, serum  Result Value Ref Range   Antigliadin Abs, IgA 5 0 - 19 units   Gliadin IgG 7 0 - 19 units  VITAMIN D 25 Hydroxy (Vit-D Deficiency, Fractures)  Result Value Ref Range   Vit D, 25-Hydroxy 29.3 (L) 30.0 - 100.0 ng/mL  Reticulin Antibody, IgA w titer  Result Value Ref Range   Reticulin Ab, IgA Negative Neg:<1:2.5 titer  Tissue transglutaminase, IgA  Result Value Ref Range   Transglutaminase IgA <2 0 - 3 U/mL      Assessment & Plan:   Problem List Items Addressed This Visit    None    Visit Diagnoses    Routine general medical examination at a health care facility    -  Primary   Vaccines up to date. Screening labs checked last visit. Continue diet and exercise. Call with any concerns.    Flu vaccine need       Flu shot given today.   Relevant Orders   Flu Vaccine QUAD 6+ mos PF IM (Fluarix Quad PF)   Exposure to mumps virus       Will check titres and vaccinate as needed.    Relevant Orders   Measles/Mumps/Rubella Immunity       LABORATORY TESTING:  Health maintenance labs ordered today as discussed above.   IMMUNIZATIONS:   - Tdap: Tetanus vaccination status reviewed: last tetanus booster within 10 years. - Influenza: Given today - Pneumovax: Not applicable  PATIENT COUNSELING:    Sexuality: Discussed sexually transmitted diseases, partner selection, use of condoms, avoidance of unintended pregnancy  and contraceptive alternatives.   Advised to avoid cigarette smoking.  I discussed with the patient that most people either  abstain from alcohol or drink within safe  limits (<=14/week and <=4 drinks/occasion for males, <=7/weeks and <= 3 drinks/occasion for females) and that the risk for alcohol disorders and other health effects rises proportionally with the number of drinks per week and how often a drinker exceeds daily limits.  Discussed cessation/primary prevention of drug use and availability of treatment for abuse.   Diet: Encouraged to adjust caloric intake to maintain  or achieve ideal body weight, to reduce intake of dietary saturated fat and total fat, to limit sodium intake by avoiding high sodium foods and not adding table salt, and to maintain adequate dietary potassium and calcium preferably from fresh fruits, vegetables, and low-fat dairy products.    stressed the importance of regular exercise  Injury prevention: Discussed safety belts, safety helmets, smoke detector, smoking near bedding or upholstery.   Dental health: Discussed importance of regular tooth brushing, flossing, and dental visits.   Follow up plan: NEXT PREVENTATIVE PHYSICAL DUE IN 1 YEAR. Return in about 1 year (around 10/12/2019) for Physical.

## 2018-10-12 ENCOUNTER — Encounter: Payer: Self-pay | Admitting: Family Medicine

## 2018-10-12 LAB — MEASLES/MUMPS/RUBELLA IMMUNITY
MUMPS ABS, IGG: 29.4 AU/mL (ref >10.9)
RUBELLA: 3.29 {index} (ref >0.99)
RUBEOLA AB, IGG: 72.9 AU/mL (ref >16.4)

## 2018-10-17 NOTE — ED Provider Notes (Signed)
[facility] Emergency Department Provider Note  ____________________________________________  Time seen: Approximately 8 AM  I have reviewed the triage vital signs and the nursing notes.   HISTORY  Chief Complaint Rabies Injection   HPI Kenneth Richard is a 32 y.o. male presents to the emergency department for third rabies vaccination.  He denies any difficulty or adverse reaction with previous.  He denies any new symptoms of concern.  Past Medical History:  Diagnosis Date  . Anxiety   . Broken bones   . Depression     Patient Active Problem List   Diagnosis Date Noted  . 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 Anxiety 08/13/2018    Past Surgical History:  Procedure Laterality Date  . MOUTH SURGERY      Prior to Admission medications   Medication Sig Start Date End Date Taking? Authorizing Provider  fluticasone (FLONASE) 50 MCG/ACT nasal spray U 2 SPRAYS  IEN ONCE D 08/26/18   [provider]  IBUPROFEN IB PO Take by mouth as needed.    [provider]    Allergies Patient has no known allergies.  Family History  Problem Relation Age of Onset  . Anxiety disorder Mother   . Depression Mother   . Anxiety disorder Father   . Depression Father   . Cowden syndrome Brother   . Breast cancer Maternal Grandmother   . Leukemia Maternal Grandfather   . Multiple myeloma Maternal Grandfather   . Alzheimer's disease Paternal Grandfather   . Parkinson's disease Paternal Uncle     Social History Social History   Tobacco Use  . Smoking status: Never Smoker  . Smokeless tobacco: Never Used  Substance Use Topics  . Alcohol use: Yes    Alcohol/week: 8.0 standard drinks    Types: 8 Glasses of wine per week  . Drug use: Not Currently    Review of Systems Constitutional: No fever/chills Eyes: No visual changes. Respiratory: Denies shortness of breath. Musculoskeletal: Negative for pain. Skin: Negative for concern of  infection. Neurological: Negative for headaches, focal weakness or numbness. ____________________________________________   PHYSICAL EXAM:  VITAL SIGNS: ED Triage Vitals  Enc Vitals Group     BP 10/02/18 0726 100/75     Pulse Rate 10/02/18 0726 100     Resp 10/02/18 0726 20     Temp 10/02/18 0726 97.8 F (36.6 C)     Temp Source 10/02/18 0726 Oral     SpO2 10/02/18 0726 99 %     Weight 10/02/18 0727 170 lb (77.1 kg)     Height 10/02/18 0727 5' 9" (1.753 m)     Head Circumference --      Peak Flow --      Pain Score 10/02/18 0727 0     Pain Loc --      Pain Edu? --      Excl. in GC? --     Constitutional: Alert and oriented. Well appearing and in no acute distress. Eyes: Conjunctivae are normal. PERRL. EOMI. Head: Atraumatic. Nose: No congestion/rhinnorhea. Mouth/Throat: Mucous membranes are moist.  Oropharynx non-erythematous. Neck: No stridor.  Cardiovascular: Normal rate, regular rhythm.  Respiratory: Normal respiratory effort.  No retractions.. Gastrointestinal: Soft and nontender. No distention Musculoskeletal: No lower extremity tenderness nor edema.  No joint effusions. Neurologic:  Normal speech and language. No gross focal neurologic deficits are appreciated. Speech is normal. No gait instability. Skin:  Skin is warm, dry and intact.  Psychiatric: Mood and affect are normal. Speech   and behavior are normal.  ____________________________________________   LABS (all labs ordered are listed, but only abnormal results are displayed)  Labs Reviewed - No data to display ____________________________________________   RADIOLOGY  None indicated ____________________________________________   PROCEDURES  Procedure(s) performed: None  ____________________________________________   INITIAL IMPRESSION / ASSESSMENT AND PLAN / ED COURSE     Pertinent labs & imaging results that were available during my care of the patient were reviewed by me and considered in  my medical decision making (see chart for details).  32-year-old male presents for rabies vaccination.  He is aware to return in 1 week for the final 1.  He was advised to return sooner for any symptom of concern. ____________________________________________   FINAL CLINICAL IMPRESSION(S) / ED DIAGNOSES  Final diagnoses:  Encounter for repeat administration of rabies vaccination    Note:  This document was prepared using Dragon voice recognition software and may include unintentional dictation errors.    Triplett, Cari B, FNP 10/17/18 2237    Malinda, Paul F, MD 10/25/18 1415  

## 2019-10-13 ENCOUNTER — Other Ambulatory Visit: Payer: Self-pay

## 2019-10-13 ENCOUNTER — Encounter: Payer: Self-pay | Admitting: Family Medicine

## 2019-10-13 ENCOUNTER — Ambulatory Visit (INDEPENDENT_AMBULATORY_CARE_PROVIDER_SITE_OTHER): Payer: 59 | Admitting: Family Medicine

## 2019-10-13 VITALS — BP 109/67 | HR 62 | Temp 97.8°F | Ht 67.0 in | Wt 170.8 lb

## 2019-10-13 DIAGNOSIS — Z0000 Encounter for general adult medical examination without abnormal findings: Secondary | ICD-10-CM

## 2019-10-13 DIAGNOSIS — Z23 Encounter for immunization: Secondary | ICD-10-CM

## 2019-10-13 NOTE — Patient Instructions (Signed)

## 2019-10-13 NOTE — Progress Notes (Signed)
 BP 109/67 (BP Location: Left Arm, Patient Position: Sitting, Cuff Size: Normal)   Pulse 62   Temp 97.8 F (36.6 C) (Oral)   Ht 5' 7" (1.702 m)   Wt 170 lb 12.8 oz (77.5 kg)   SpO2 98%   BMI 26.75 kg/m    Subjective:    Patient ID: Kenneth Richard, male    DOB: 03/18/1986, 33 y.o.   MRN: [id number]  HPI: Kenneth Richard is a 33 y.o. male presenting on 10/13/2019 for comprehensive medical examination. Current medical complaints include:none  He currently lives with: wife Interim Problems from his last visit: no  Depression Screen done today and results listed below:  Depression screen PHQ 2/9 10/13/2019 10/11/2018 08/13/2018  Decreased Interest 0 0 0  Down, Depressed, Hopeless 0 0 0  PHQ - 2 Score 0 0 0  Altered sleeping 1 0 1  Tired, decreased energy 0 0 0  Change in appetite 1 0 0  Feeling bad or failure about yourself  0 0 0  Trouble concentrating 0 0 2  Moving slowly or fidgety/restless 2 0 2  Suicidal thoughts 0 0 0  PHQ-9 Score 4 0 5  Difficult doing work/chores - - Not difficult at all    Past Medical History:  Past Medical History:  Diagnosis Date  . Anxiety   . Broken bones   . Depression     Surgical History:  Past Surgical History:  Procedure Laterality Date  . MOUTH SURGERY      Medications:  Current Outpatient Medications on File Prior to Visit  Medication Sig  . IBUPROFEN IB PO Take by mouth as needed.   No current facility-administered medications on file prior to visit.    Allergies:  No Known Allergies  Social History:  Social History   Socioeconomic History  . Marital status: Married    Spouse name: Not on file  . Number of children: Not on file  . Years of education: Not on file  . Highest education level: Not on file  Occupational History  . Not on file  Tobacco Use  . Smoking status: Never Smoker  . Smokeless tobacco: Never Used  Substance and Sexual Activity  . Alcohol use: Yes    Alcohol/week: 8.0 standard drinks   Types: 8 Glasses of wine per week  . Drug use: Not Currently  . Sexual activity: Yes  Other Topics Concern  . Not on file  Social History Narrative  . Not on file   Social Determinants of Health   Financial Resource Strain:   . Difficulty of Paying Living Expenses: Not on file  Food Insecurity:   . Worried About Running Out of Food in the Last Year: Not on file  . Ran Out of Food in the Last Year: Not on file  Transportation Needs:   . Lack of Transportation (Medical): Not on file  . Lack of Transportation (Non-Medical): Not on file  Physical Activity:   . Days of Exercise per Week: Not on file  . Minutes of Exercise per Session: Not on file  Stress:   . Feeling of Stress : Not on file  Social Connections:   . Frequency of Communication with Friends and Family: Not on file  . Frequency of Social Gatherings with Friends and Family: Not on file  . Attends Religious Services: Not on file  . Active Member of Clubs or Organizations: Not on file  . Attends Club or Organization Meetings: Not on file  . Marital Status:   Not on file  Intimate Partner Violence:   . Fear of Current or Ex-Partner: Not on file  . Emotionally Abused: Not on file  . Physically Abused: Not on file  . Sexually Abused: Not on file   Social History   Tobacco Use  Smoking Status Never Smoker  Smokeless Tobacco Never Used   Social History   Substance and Sexual Activity  Alcohol Use Yes  . Alcohol/week: 8.0 standard drinks  . Types: 8 Glasses of wine per week    Family History:  Family History  Problem Relation Age of Onset  . Anxiety disorder Mother   . Depression Mother   . Anxiety disorder Father   . Depression Father   . Cowden syndrome Brother   . Breast cancer Maternal Grandmother   . Leukemia Maternal Grandfather   . Multiple myeloma Maternal Grandfather   . Alzheimer's disease Paternal Grandfather   . Parkinson's disease Paternal Uncle     Past medical history, surgical history,  medications, allergies, family history and social history reviewed with patient today and changes made to appropriate areas of the chart.   Review of Systems  Constitutional: Negative.   HENT: Negative.        Ear clogged  Eyes: Negative.   Respiratory: Negative.   Cardiovascular: Negative.   Gastrointestinal: Positive for diarrhea. Negative for abdominal pain, blood in stool, constipation, heartburn, melena, nausea and vomiting.  Genitourinary: Negative.   Musculoskeletal: Negative.   Neurological: Negative.   Endo/Heme/Allergies: Negative.   Psychiatric/Behavioral: Negative for depression, hallucinations, memory loss, substance abuse and suicidal ideas. The patient is nervous/anxious. The patient does not have insomnia.     All other ROS negative except what is listed above and in the HPI.      Objective:    BP 109/67 (BP Location: Left Arm, Patient Position: Sitting, Cuff Size: Normal)   Pulse 62   Temp 97.8 F (36.6 C) (Oral)   Ht 5' 7" (1.702 m)   Wt 170 lb 12.8 oz (77.5 kg)   SpO2 98%   BMI 26.75 kg/m   Wt Readings from Last 3 Encounters:  10/13/19 170 lb 12.8 oz (77.5 kg)  10/11/18 169 lb 9.6 oz (76.9 kg)  10/09/18 169 lb 12.1 oz (77 kg)    Physical Exam Vitals and nursing note reviewed.  Constitutional:      General: He is not in acute distress.    Appearance: Normal appearance. He is normal weight. He is not ill-appearing, toxic-appearing or diaphoretic.  HENT:     Head: Normocephalic and atraumatic.     Right Ear: Tympanic membrane, ear canal and external ear normal. There is no impacted cerumen.     Left Ear: Tympanic membrane, ear canal and external ear normal. There is no impacted cerumen.     Nose: Nose normal. No congestion or rhinorrhea.     Mouth/Throat:     Mouth: Mucous membranes are moist.     Pharynx: Oropharynx is clear. No oropharyngeal exudate or posterior oropharyngeal erythema.  Eyes:     General: No scleral icterus.       Right eye: No  discharge.        Left eye: No discharge.     Extraocular Movements: Extraocular movements intact.     Conjunctiva/sclera: Conjunctivae normal.     Pupils: Pupils are equal, round, and reactive to light.  Neck:     Vascular: No carotid bruit.  Cardiovascular:     Rate and Rhythm: Normal rate and  regular rhythm.     Pulses: Normal pulses.     Heart sounds: No murmur. No friction rub. No gallop.   Pulmonary:     Effort: Pulmonary effort is normal. No respiratory distress.     Breath sounds: Normal breath sounds. No stridor. No wheezing, rhonchi or rales.  Chest:     Chest wall: No tenderness.  Abdominal:     General: Abdomen is flat. Bowel sounds are normal. There is no distension.     Palpations: Abdomen is soft. There is no mass.     Tenderness: There is no abdominal tenderness. There is no right CVA tenderness, left CVA tenderness, guarding or rebound.     Hernia: No hernia is present.  Genitourinary:    Comments: Genital exam deferred with shared decision making Musculoskeletal:        General: No swelling, tenderness, deformity or signs of injury.     Cervical back: Normal range of motion and neck supple. No rigidity. No muscular tenderness.     Right lower leg: No edema.     Left lower leg: No edema.  Lymphadenopathy:     Cervical: No cervical adenopathy.  Skin:    General: Skin is warm and dry.     Capillary Refill: Capillary refill takes less than 2 seconds.     Coloration: Skin is not jaundiced or pale.     Findings: No bruising, erythema, lesion or rash.  Neurological:     General: No focal deficit present.     Mental Status: He is alert and oriented to person, place, and time.     Cranial Nerves: No cranial nerve deficit.     Sensory: No sensory deficit.     Motor: No weakness.     Coordination: Coordination normal.     Gait: Gait normal.     Deep Tendon Reflexes: Reflexes normal.  Psychiatric:        Mood and Affect: Mood normal.        Behavior: Behavior  normal.        Thought Content: Thought content normal.        Judgment: Judgment normal.     Results for orders placed or performed in visit on 10/11/18  Measles/Mumps/Rubella Immunity  Result Value Ref Range   Rubella Antibodies, IGG 3.29 Immune >0.99 index   RUBEOLA AB, IGG 72.9 Immune >16.4 AU/mL   MUMPS ABS, IGG 29.4 Immune >10.9 AU/mL      Assessment & Plan:   Problem List Items Addressed This Visit    None    Visit Diagnoses    Routine general medical examination at a health care facility    -  Primary   Vaccines up to date. Labs normal last year- will check again next year. Continue diet and exercise. Call with any concerns.   Need for influenza vaccination       Flu shot given today.   Relevant Orders   Flu Vaccine QUAD 6+ mos PF IM (Fluarix Quad PF) (Completed)       LABORATORY TESTING:  Health maintenance labs ordered today as discussed above.   IMMUNIZATIONS:   - Tdap: Tetanus vaccination status reviewed: last tetanus booster within 10 years. - Influenza: Administered today - Pneumovax: Not applicable  PATIENT COUNSELING:    Sexuality: Discussed sexually transmitted diseases, partner selection, use of condoms, avoidance of unintended pregnancy  and contraceptive alternatives.   Advised to avoid cigarette smoking.  I discussed with the patient that most people either abstain   from alcohol or drink within safe limits (<=14/week and <=4 drinks/occasion for males, <=7/weeks and <= 3 drinks/occasion for females) and that the risk for alcohol disorders and other health effects rises proportionally with the number of drinks per week and how often a drinker exceeds daily limits.  Discussed cessation/primary prevention of drug use and availability of treatment for abuse.   Diet: Encouraged to adjust caloric intake to maintain  or achieve ideal body weight, to reduce intake of dietary saturated fat and total fat, to limit sodium intake by avoiding high sodium foods and  not adding table salt, and to maintain adequate dietary potassium and calcium preferably from fresh fruits, vegetables, and low-fat dairy products.    stressed the importance of regular exercise  Injury prevention: Discussed safety belts, safety helmets, smoke detector, smoking near bedding or upholstery.   Dental health: Discussed importance of regular tooth brushing, flossing, and dental visits.   Follow up plan: NEXT PREVENTATIVE PHYSICAL DUE IN 1 YEAR. Return in about 1 year (around 10/12/2020) for Physical.  

## 2020-07-06 ENCOUNTER — Other Ambulatory Visit: Payer: Self-pay

## 2020-07-06 ENCOUNTER — Telehealth: Payer: 59 | Admitting: Registered Nurse

## 2020-07-06 ENCOUNTER — Encounter: Payer: Self-pay | Admitting: Registered Nurse

## 2020-07-06 DIAGNOSIS — J029 Acute pharyngitis, unspecified: Secondary | ICD-10-CM

## 2020-07-06 NOTE — Patient Instructions (Addendum)
Allergic Rhinitis, Adult Allergic rhinitis is an allergic reaction that affects the mucous membrane inside the nose. It causes sneezing, a runny or stuffy nose, and the feeling of mucus going down the back of the throat (postnasal drip). Allergic rhinitis can be mild to severe. There are two types of allergic rhinitis:  Seasonal. This type is also called hay fever. It happens only during certain seasons.  Perennial. This type can happen at any time of the year. What are the causes? This condition happens when the body's defense system (immune system) responds to certain harmless substances called allergens as though they were germs.  Seasonal allergic rhinitis is triggered by pollen, which can come from grasses, trees, and weeds. Perennial allergic rhinitis may be caused by:  House dust mites.  Pet dander.  Mold spores. What are the signs or symptoms? Symptoms of this condition include:  Sneezing.  Runny or stuffy nose (nasal congestion).  Postnasal drip.  Itchy nose.  Tearing of the eyes.  Trouble sleeping.  Daytime sleepiness. How is this diagnosed? This condition may be diagnosed based on:  Your medical history.  A physical exam.  Tests to check for related conditions, such as: ? Asthma. ? Pink eye. ? Ear infection. ? Upper respiratory infection.  Tests to find out which allergens trigger your symptoms. These may include skin or blood tests. How is this treated? There is no cure for this condition, but treatment can help control symptoms. Treatment may include:  Taking medicines that block allergy symptoms, such as antihistamines. Medicine may be given as a shot, nasal spray, or pill.  Avoiding the allergen.  Desensitization. This treatment involves getting ongoing shots until your body becomes less sensitive to the allergen. This treatment may be done if other treatments do not help.  If taking medicine and avoiding the allergen does not work, new, stronger  medicines may be prescribed. Follow these instructions at home:  Find out what you are allergic to. Common allergens include smoke, dust, and pollen.  Avoid the things you are allergic to. These are some things you can do to help avoid allergens: ? Replace carpet with wood, tile, or vinyl flooring. Carpet can trap dander and dust. ? Do not smoke. Do not allow smoking in your home. ? Change your heating and air conditioning filter at least once a month. ? During allergy season:  Keep windows closed as much as possible.  Plan outdoor activities when pollen counts are lowest. This is usually during the evening hours.  When coming indoors, change clothing and shower before sitting on furniture or bedding.  Take over-the-counter and prescription medicines only as told by your health care provider.  Keep all follow-up visits as told by your health care provider. This is important. Contact a health care provider if:  You have a fever.  You develop a persistent cough.  You make whistling sounds when you breathe (you wheeze).  Your symptoms interfere with your normal daily activities. Get help right away if:  You have shortness of breath. Summary  This condition can be managed by taking medicines as directed and avoiding allergens.  Contact your health care provider if you develop a persistent cough or fever.  During allergy season, keep windows closed as much as possible. This information is not intended to replace advice given to you by your health care provider. Make sure you discuss any questions you have with your health care provider. Document Revised: 09/25/2017 Document Reviewed: 11/20/2016 Elsevier Patient Education  2020   Elsevier Inc. How to Perform a Sinus Rinse A sinus rinse is a home treatment that is used to rinse your sinuses with a sterile mixture of salt and water (saline solution). Sinuses are air-filled spaces in your skull behind the bones of your face and  forehead that open into your nasal cavity. A sinus rinse can help to clear mucus, dirt, dust, or pollen from your nasal cavity. You may do a sinus rinse when you have a cold, a virus, nasal allergy symptoms, a sinus infection, or stuffiness in your nose or sinuses. Talk with your health care provider about whether a sinus rinse might help you. What are the risks? A sinus rinse is generally safe and effective. However, there are a few risks, which include:  A burning sensation in your sinuses. This may happen if you do not make the saline solution as directed. Be sure to follow all directions when making the saline solution.  Nasal irritation.  Infection from contaminated water. This is rare, but possible. Do not do a sinus rinse if you have had ear or nasal surgery, ear infection, or blocked ears. Supplies needed:  Saline solution or powder.  Distilled or sterile water may be needed to mix with saline powder. ? You may use boiled and cooled tap water. Boil tap water for 5 minutes; cool until it is lukewarm. Use within 24 hours. ? Do not use regular tap water to mix with the saline solution.  Neti pot or nasal rinse bottle. These supplies release the saline solution into your nose and through your sinuses. Neti pots and nasal rinse bottles can be purchased at [employer], a health food store, or online. How to perform a sinus rinse  1. Wash your hands with soap and water. 2. Wash your device according to the directions that came with the product and then dry it. 3. Use the solution that comes with your product or one that is sold separately in stores. Follow the mixing directions on the package if you need to mix with sterile or distilled water. 4. Fill the device with the amount of saline solution noted in the device instructions. 5. Stand over a sink and tilt your head sideways over the sink. 6. Place the spout of the device in your upper nostril (the one closer to the  ceiling). 7. Gently pour or squeeze the saline solution into your nasal cavity. The liquid should drain out from the lower nostril if you are not too congested. 8. While rinsing, breathe through your open mouth. 9. Gently blow your nose to clear any mucus and rinse solution. Blowing too hard may cause ear pain. 10. Repeat in your other nostril. 11. Clean and rinse your device with clean water and then air-dry it. Talk with your health care provider or pharmacist if you have questions about how to do a sinus rinse. Summary  A sinus rinse is a home treatment that is used to rinse your sinuses with a sterile mixture of salt and water (saline solution).  A sinus rinse is generally safe and effective. Follow all instructions carefully.  Before doing a sinus rinse, talk with your health care provider about whether it would be helpful for you. This information is not intended to replace advice given to you by your health care provider. Make sure you discuss any questions you have with your health care provider. Document Revised: 08/10/2017 Document Reviewed: 08/10/2017 Elsevier Patient Education  2020 Elsevier Inc. Nonallergic Rhinitis Nonallergic rhinitis is a  condition that causes symptoms that affect the nose, such as a runny nose and a stuffed-up nose (nasal congestion) that can make it hard to breathe through the nose. This condition is different from having an allergy (allergic rhinitis). Allergic rhinitis occurs when the body's defense system (immune system) reacts to a substance that you are allergic to (allergen), such as pollen, pet dander, mold, or dust. Nonallergic rhinitis has many similar symptoms, but it is not caused by allergens. Nonallergic rhinitis can be a short-term or long-term problem. What are the causes? This condition can be caused by many different things. Some common types of nonallergic rhinitis include: Infectious rhinitis  This is usually due to an infection in the  upper respiratory tract. Vasomotor rhinitis  This is the most common type of long-term nonallergic rhinitis.  It is caused by too much blood flow through the nose, which makes the tissue inside of the nose swell.  Symptoms are often triggered by strong odors, cold air, stress, drinking alcohol, cigarette smoke, or changes in the weather. Occupational rhinitis  This type is caused by triggers in the workplace, such as chemicals, dusts, animal dander, or air pollution. Hormonal rhinitis  This type occurs in women as a result of an increase in the male hormone estrogen.  It may occur during pregnancy, puberty, and menstrual cycles.  Symptoms improve when estrogen levels drop. Drug-induced rhinitis Several drugs can cause nonallergic rhinitis, including:  Medicines that are used to treat high blood pressure, heart disease, and Parkinson disease.  Aspirin and NSAIDs.  Over-the-counter nasal decongestant sprays. These can cause a type of nonallergic rhinitis (rhinitis medicamentosa) when they are used for more than a few days. Nonallergic rhinitis with eosinophilia syndrome (NARES)  This type is caused by having too much of a certain type of white blood cell (eosinophil). Nonallergic rhinitis can also be caused by a reaction to eating hot or spicy foods. This does not usually cause long-term symptoms. In some cases, the cause of nonallergic rhinitis is not known. What increases the risk? You are more likely to develop this condition if:  You are 31-33 years of age.  You are a woman. Women are twice as likely to have this condition. What are the signs or symptoms? Common symptoms of this condition include:  Nasal congestion.  Runny nose.  The feeling of mucus going down the back of the throat (postnasal drip).  Trouble sleeping at night and daytime sleepiness. Less common symptoms include:  Sneezing.  Coughing.  Itchy nose.  Bloodshot eyes. How is this  diagnosed? This condition may be diagnosed based on:  Your symptoms and medical history.  A physical exam.  Allergy testing to rule out allergic rhinitis. You may have skin tests or blood tests. In some cases, the health care provider may take a swab of nasal secretions to look for an increased number of eosinophils. This would be done to confirm a diagnosis of NARES. How is this treated? Treatment for this condition depends on the cause. No single treatment works for everyone. Work with your health care provider to find the best treatment for you. Treatment may include:  Avoiding the things that trigger your symptoms.  Using medicines to relieve congestion, such as: ? Steroid nasal spray. There are many types. You may need to try a few to find out which one works best. ? Decongestant medicine. This may be an oral medicine or a nasal spray. These medicines are only used for a short time.  Using  medicines to relieve a runny nose. These may include antihistamine medicines or anticholinergic nasal sprays.  Surgery to remove tissue from inside the nose may be needed in severe cases if the condition has not improved after 6-12 months of medical treatment. Follow these instructions at home:  Take or use over-the-counter and prescription medicines only as told by your health care provider. Do not stop using your medicine even if you start to feel better.  Use salt-water (saline) rinses or other solutions (nasal washes or irrigations) to wash or rinse out the inside of your nose as told by your health care provider.  Do not take NSAIDs or medicines that contain aspirin if they make your symptoms worse.  Do not drink alcohol if it makes your symptoms worse.  Do not use any tobacco products, such as cigarettes, chewing tobacco, and e-cigarettes. If you need help quitting, ask your health care provider.  Avoid secondhand smoke.  Get some exercise every day. Exercise may help reduce symptoms  of nonallergic rhinitis for some people. Ask your health care provider how much exercise and what types of exercise are safe for you.  Sleep with the head of your bed raised (elevated). This may reduce nighttime nasal congestion.  Keep all follow-up visits as told by your health care provider. This is important. Contact a health care provider if:  You have a fever.  Your symptoms are getting worse at home.  Your symptoms are not responding to medicine.  You develop new symptoms, especially a headache or nosebleed. This information is not intended to replace advice given to you by your health care provider. Make sure you discuss any questions you have with your health care provider. Document Revised: 09/25/2017 Document Reviewed: 01/03/2016 Elsevier Patient Education  2020 [employer] COVID-19 Frequently Asked Questions COVID-19 (coronavirus disease) is an infection that is caused by a large family of viruses. Some viruses cause illness in people and others cause illness in animals like camels, cats, and bats. In some cases, the viruses that cause illness in animals can spread to humans. Where did the coronavirus come from? In December 2019, Armeniahina told the [employer] [facility](WHO) of several cases of lung disease (human respiratory illness). These cases were linked to an open seafood and [place] the [place]. The link to the seafood and livestock market suggests that the virus may have spread from animals to humans. However, since that first outbreak in December, the virus has also been shown to spread from person to person. What is the name of the disease and the virus? Disease name Early on, this disease was called novel coronavirus. This is because scientists determined that the disease was caused by a new (novel) respiratory virus. The World Health Organization [facility](WHO) has now named the disease COVID-19, or coronavirus disease. Virus name The virus that causes the  disease is called severe acute respiratory syndrome coronavirus 2 (SARS-CoV-2). More information on disease and virus naming World Health Organization [facility](WHO): www.who.int/emergencies/diseases/novel-coronavirus-2019/technical-guidance/naming-the-coronavirus-disease-(covid-2019)-and-the-virus-that-causes-it Who is at risk for complications from coronavirus disease? Some people may be at higher risk for complications from coronavirus disease. This includes older adults and people who have chronic diseases, such as heart disease, diabetes, and lung disease. If you are at higher risk for complications, take these extra precautions:  Stay home as much as possible.  Avoid social gatherings and travel.  Avoid close contact with others. Stay at least 6 ft (2 m) away from others, if possible.  Wash your hands  often with soap and water for at least 20 seconds.  Avoid touching your face, mouth, nose, or eyes.  Keep supplies on hand at home, such as food, medicine, and cleaning supplies.  If you must go out in public, wear a cloth face covering or face mask. Make sure your mask covers your nose and mouth. How does coronavirus disease spread? The virus that causes coronavirus disease spreads easily from person to person (is contagious). You may catch the virus by:  Breathing in droplets from an infected person. Droplets can be spread by a person breathing, speaking, singing, coughing, or sneezing.  Touching something, like a table or a doorknob, that was exposed to the virus (contaminated) and then touching your mouth, nose, or eyes. Can I get the virus from touching surfaces or objects? There is still a lot that we do not know about the virus that causes coronavirus disease. Scientists are basing a lot of information on what they know about similar viruses, such as:  Viruses cannot generally survive on surfaces for long. They need a human body (host) to survive.  It is more likely that the virus is  spread by close contact with people who are sick (direct contact), such as through: ? Shaking hands or hugging. ? Breathing in respiratory droplets that travel through the air. Droplets can be spread by a person breathing, speaking, singing, coughing, or sneezing.  It is less likely that the virus is spread when a person touches a surface or object that has the virus on it (indirect contact). The virus may be able to enter the body if the person touches a surface or object and then touches his or her face, eyes, nose, or mouth. Can a person spread the virus without having symptoms of the disease? It may be possible for the virus to spread before a person has symptoms of the disease, but this is most likely not the main way the virus is spreading. It is more likely for the virus to spread by being in close contact with people who are sick and breathing in the respiratory droplets spread by a person breathing, speaking, singing, coughing, or sneezing. What are the symptoms of coronavirus disease? Symptoms vary from person to person and can range from mild to severe. Symptoms may include:  Fever or chills.  Cough.  Difficulty breathing or feeling short of breath.  Headaches, body aches, or muscle aches.  Runny or stuffy (congested) nose.  Sore throat.  New loss of taste or smell.  Nausea, vomiting, or diarrhea. These symptoms can appear anywhere from 2 to 14 days after you have been exposed to the virus. Some people may not have any symptoms. If you develop symptoms, call your health care provider. People with severe symptoms may need hospital care. Should I be tested for this virus? Your health care provider will decide whether to test you based on your symptoms, history of exposure, and your risk factors. How does a health care provider test for this virus? Health care providers will collect samples to send for testing. Samples may include:  Taking a swab of fluid from the back of your  nose and throat, your nose, or your throat.  Taking fluid from the lungs by having you cough up mucus (sputum) into a sterile cup.  Taking a blood sample. Is there a treatment or vaccine for this virus? Currently, there is no vaccine to prevent coronavirus disease. Also, there are no medicines like antibiotics or antivirals to treat  the virus. A person who becomes sick is given supportive care, which means rest and fluids. A person may also relieve his or her symptoms by using over-the-counter medicines that treat sneezing, coughing, and runny nose. These are the same medicines that a person takes for the common cold. If you develop symptoms, call your health care provider. People with severe symptoms may need hospital care. What can I do to protect myself and my family from this virus?     You can protect yourself and your family by taking the same actions that you would take to prevent the spread of other viruses. Take the following actions:  Wash your hands often with soap and water for at least 20 seconds. If soap and water are not available, use alcohol-based hand sanitizer.  Avoid touching your face, mouth, nose, or eyes.  Cough or sneeze into a tissue, sleeve, or elbow. Do not cough or sneeze into your hand or the air. ? If you cough or sneeze into a tissue, throw it away immediately and wash your hands.  Disinfect objects and surfaces that you frequently touch every day.  Stay away from people who are sick.  Avoid going out in public, follow guidance from your state and local health authorities.  Avoid crowded indoor spaces. Stay at least 6 ft (2 m) away from others.  If you must go out in public, wear a cloth face covering or face mask. Make sure your mask covers your nose and mouth.  Stay home if you are sick, except to get medical care. Call your health care provider before you get medical care. Your health care provider will tell you how long to stay home.  Make sure your  vaccines are up to date. Ask your health care provider what vaccines you need. What should I do if I need to travel? Follow travel recommendations from your local health authority, the CDC, and WHO. Travel information and advice  Centers for Disease Control and Prevention (CDC): GeminiCard.gl  World Health Organization [facility]): PreviewDomains.se Know the risks and take action to protect your health  You are at higher risk of getting coronavirus disease if you are traveling to areas with an outbreak or if you are exposed to travelers from areas with an outbreak.  Wash your hands often and practice good hygiene to lower the risk of catching or spreading the virus. What should I do if I am sick? General instructions to stop the spread of infection  Wash your hands often with soap and water for at least 20 seconds. If soap and water are not available, use alcohol-based hand sanitizer.  Cough or sneeze into a tissue, sleeve, or elbow. Do not cough or sneeze into your hand or the air.  If you cough or sneeze into a tissue, throw it away immediately and wash your hands.  Stay home unless you must get medical care. Call your health care provider or local health authority before you get medical care.  Avoid public areas. Do not take public transportation, if possible.  If you can, wear a mask if you must go out of the house or if you are in close contact with someone who is not sick. Make sure your mask covers your nose and mouth. Keep your home clean  Disinfect objects and surfaces that are frequently touched every day. This may include: ? Counters and tables. ? Doorknobs and light switches. ? Sinks and faucets. ? Electronics such as phones, remote controls, keyboards, computers, and  tablets.  Wash dishes in hot, soapy water or use a dishwasher. Air-dry your dishes.  Wash laundry in hot  water. Prevent infecting other household members  Let healthy household members care for children and pets, if possible. If you have to care for children or pets, wash your hands often and wear a mask.  Sleep in a different bedroom or bed, if possible.  Do not share personal items, such as razors, toothbrushes, deodorant, combs, brushes, towels, and washcloths. Where to find more information Centers for Disease Control and Prevention (CDC)  Information and news updates: CardRetirement.cz World Health Organization [facility])  Information and news updates: AffordableSalon.es  Coronavirus health topic: https://thompson-craig.com/  Questions and answers on COVID-19: kruiseway.com  Global tracker: who.sprinklr.com American Academy of Pediatrics (AAP)  Information for families: www.healthychildren.org/English/health-issues/conditions/chest-lungs/Pages/2019-Novel-Coronavirus.aspx The coronavirus situation is changing rapidly. Check your local health authority website or the CDC and [facility] websites for updates and news. When should I contact a health care provider?  Contact your health care provider if you have symptoms of an infection, such as fever or cough, and you: ? Have been near anyone who is known to have coronavirus disease. ? Have come into contact with a person who is suspected to have coronavirus disease. ? Have traveled to an area where there is an outbreak of COVID-19. When should I get emergency medical care?  Get help right away by calling your local emergency services (911 in the [place] if you have: ? Trouble breathing. ? Pain or pressure in your chest. ? Confusion. ? Blue-tinged lips and fingernails. ? Difficulty waking from sleep. ? Symptoms that get worse. Let the emergency medical personnel know if you think you have coronavirus disease. Summary  A new respiratory virus  is spreading from person to person and causing COVID-19 (coronavirus disease).  The virus that causes COVID-19 appears to spread easily. It spreads from one person to another through droplets from breathing, speaking, singing, coughing, or sneezing.  Older adults and those with chronic diseases are at higher risk of disease. If you are at higher risk for complications, take extra precautions.  There is currently no vaccine to prevent coronavirus disease. There are no medicines, such as antibiotics or antivirals, to treat the virus.  You can protect yourself and your family by washing your hands often, avoiding touching your face, and covering your coughs and sneezes. This information is not intended to replace advice given to you by your health care provider. Make sure you discuss any questions you have with your health care provider. Document Revised: 08/12/2019 Document Reviewed: 02/08/2019 Elsevier Patient Education  2020 Elsevier Inc. COVID-19 COVID-19 is a respiratory infection that is caused by a virus called severe acute respiratory syndrome coronavirus 2 (SARS-CoV-2). The disease is also known as coronavirus disease or novel coronavirus. In some people, the virus may not cause any symptoms. In others, it may cause a serious infection. The infection can get worse quickly and can lead to complications, such as:  Pneumonia, or infection of the lungs.  Acute respiratory distress syndrome or ARDS. This is a condition in which fluid build-up in the lungs prevents the lungs from filling with air and passing oxygen into the blood.  Acute respiratory failure. This is a condition in which there is not enough oxygen passing from the lungs to the body or when carbon dioxide is not passing from the lungs out of the body.  Sepsis or septic shock. This is a serious bodily reaction to an infection.  Blood  clotting problems.  Secondary infections due to bacteria or fungus.  Organ failure. This is  when your body's organs stop working. The virus that causes COVID-19 is contagious. This means that it can spread from person to person through droplets from coughs and sneezes (respiratory secretions). What are the causes? This illness is caused by a virus. You may catch the virus by:  Breathing in droplets from an infected person. Droplets can be spread by a person breathing, speaking, singing, coughing, or sneezing.  Touching something, like a table or a doorknob, that was exposed to the virus (contaminated) and then touching your mouth, nose, or eyes. What increases the risk? Risk for infection You are more likely to be infected with this virus if you:  Are within 6 feet (2 meters) of a person with COVID-19.  Provide care for or live with a person who is infected with COVID-19.  Spend time in crowded indoor spaces or live in shared housing. Risk for serious illness You are more likely to become seriously ill from the virus if you:  Are 60 years of age or older. The higher your age, the more you are at risk for serious illness.  Live in a nursing home or long-term care facility.  Have cancer.  Have a long-term (chronic) disease such as: ? Chronic lung disease, including chronic obstructive pulmonary disease or asthma. ? A long-term disease that lowers your body's ability to fight infection (immunocompromised). ? Heart disease, including heart failure, a condition in which the arteries that lead to the heart become narrow or blocked (coronary artery disease), a disease which makes the heart muscle thick, weak, or stiff (cardiomyopathy). ? Diabetes. ? Chronic kidney disease. ? Sickle cell disease, a condition in which red blood cells have an abnormal "sickle" shape. ? Liver disease.  Are obese. What are the signs or symptoms? Symptoms of this condition can range from mild to severe. Symptoms may appear any time from 2 to 14 days after being exposed to the virus. They  include:  A fever or chills.  A cough.  Difficulty breathing.  Headaches, body aches, or muscle aches.  Runny or stuffy (congested) nose.  A sore throat.  New loss of taste or smell. Some people may also have stomach problems, such as nausea, vomiting, or diarrhea. Other people may not have any symptoms of COVID-19. How is this diagnosed? This condition may be diagnosed based on:  Your signs and symptoms, especially if: ? You live in an area with a COVID-19 outbreak. ? You recently traveled to or from an area where the virus is common. ? You provide care for or live with a person who was diagnosed with COVID-19. ? You were exposed to a person who was diagnosed with COVID-19.  A physical exam.  Lab tests, which may include: ? Taking a sample of fluid from the back of your nose and throat (nasopharyngeal fluid), your nose, or your throat using a swab. ? A sample of mucus from your lungs (sputum). ? Blood tests.  Imaging tests, which may include, X-rays, CT scan, or ultrasound. How is this treated? At present, there is no medicine to treat COVID-19. Medicines that treat other diseases are being used on a trial basis to see if they are effective against COVID-19. Your health care provider will talk with you about ways to treat your symptoms. For most people, the infection is mild and can be managed at home with rest, fluids, and over-the-counter medicines. Treatment for a  serious infection usually takes places in a hospital intensive care unit (ICU). It may include one or more of the following treatments. These treatments are given until your symptoms improve.  Receiving fluids and medicines through an IV.  Supplemental oxygen. Extra oxygen is given through a tube in the nose, a face mask, or a hood.  Positioning you to lie on your stomach (prone position). This makes it easier for oxygen to get into the lungs.  Continuous positive airway pressure (CPAP) or bi-level positive  airway pressure (BPAP) machine. This treatment uses mild air pressure to keep the airways open. A tube that is connected to a motor delivers oxygen to the body.  Ventilator. This treatment moves air into and out of the lungs by using a tube that is placed in your windpipe.  Tracheostomy. This is a procedure to create a hole in the neck so that a breathing tube can be inserted.  Extracorporeal membrane oxygenation (ECMO). This procedure gives the lungs a chance to recover by taking over the functions of the heart and lungs. It supplies oxygen to the body and removes carbon dioxide. Follow these instructions at home: Lifestyle  If you are sick, stay home except to get medical care. Your health care provider will tell you how long to stay home. Call your health care provider before you go for medical care.  Rest at home as told by your health care provider.  Do not use any products that contain nicotine or tobacco, such as cigarettes, e-cigarettes, and chewing tobacco. If you need help quitting, ask your health care provider.  Return to your normal activities as told by your health care provider. Ask your health care provider what activities are safe for you. General instructions  Take over-the-counter and prescription medicines only as told by your health care provider.  Drink enough fluid to keep your urine pale yellow.  Keep all follow-up visits as told by your health care provider. This is important. How is this prevented?  There is no vaccine to help prevent COVID-19 infection. However, there are steps you can take to protect yourself and others from this virus. To protect yourself:   Do not travel to areas where COVID-19 is a risk. The areas where COVID-19 is reported change often. To identify high-risk areas and travel restrictions, check the CDC travel website: StageSync.si  If you live in, or must travel to, an area where COVID-19 is a risk, take precautions to  avoid infection. ? Stay away from people who are sick. ? Wash your hands often with soap and water for 20 seconds. If soap and water are not available, use an alcohol-based hand sanitizer. ? Avoid touching your mouth, face, eyes, or nose. ? Avoid going out in public, follow guidance from your state and local health authorities. ? If you must go out in public, wear a cloth face covering or face mask. Make sure your mask covers your nose and mouth. ? Avoid crowded indoor spaces. Stay at least 6 feet (2 meters) away from others. ? Disinfect objects and surfaces that are frequently touched every day. This may include:  Counters and tables.  Doorknobs and light switches.  Sinks and faucets.  Electronics, such as phones, remote controls, keyboards, computers, and tablets. To protect others: If you have symptoms of COVID-19, take steps to prevent the virus from spreading to others.  If you think you have a COVID-19 infection, contact your health care provider right away. Tell your health care team that  you think you may have a COVID-19 infection.  Stay home. Leave your house only to seek medical care. Do not use public transport.  Do not travel while you are sick.  Wash your hands often with soap and water for 20 seconds. If soap and water are not available, use alcohol-based hand sanitizer.  Stay away from other members of your household. Let healthy household members care for children and pets, if possible. If you have to care for children or pets, wash your hands often and wear a mask. If possible, stay in your own room, separate from others. Use a different bathroom.  Make sure that all people in your household wash their hands well and often.  Cough or sneeze into a tissue or your sleeve or elbow. Do not cough or sneeze into your hand or into the air.  Wear a cloth face covering or face mask. Make sure your mask covers your nose and mouth. Where to find more information  Centers for  Disease Control and Prevention: StickerEmporium.tn  World Health Organization: https://thompson-craig.com/ Contact a health care provider if:  You live in or have traveled to an area where COVID-19 is a risk and you have symptoms of the infection.  You have had contact with someone who has COVID-19 and you have symptoms of the infection. Get help right away if:  You have trouble breathing.  You have pain or pressure in your chest.  You have confusion.  You have bluish lips and fingernails.  You have difficulty waking from sleep.  You have symptoms that get worse. These symptoms may represent a serious problem that is an emergency. Do not wait to see if the symptoms will go away. Get medical help right away. Call your local emergency services (911 in the [place] Do not drive yourself to the hospital. Let the emergency medical personnel know if you think you have COVID-19. Summary  COVID-19 is a respiratory infection that is caused by a virus. It is also known as coronavirus disease or novel coronavirus. It can cause serious infections, such as pneumonia, acute respiratory distress syndrome, acute respiratory failure, or sepsis.  The virus that causes COVID-19 is contagious. This means that it can spread from person to person through droplets from breathing, speaking, singing, coughing, or sneezing.  You are more likely to develop a serious illness if you are 24 years of age or older, have a weak immune system, live in a nursing home, or have chronic disease.  There is no medicine to treat COVID-19. Your health care provider will talk with you about ways to treat your symptoms.  Take steps to protect yourself and others from infection. Wash your hands often and disinfect objects and surfaces that are frequently touched every day. Stay away from people who are sick and wear a mask if you are sick. This information is not intended to replace advice  given to you by your health care provider. Make sure you discuss any questions you have with your health care provider. Document Revised: 08/12/2019 Document Reviewed: 11/18/2018 Elsevier Patient Education  2020 Elsevier Inc. COVID-19: What Your Test Results Mean If you test positive for COVID-19 Take steps to help prevent the spread of COVID-19 Stay home.  Do not leave your home, except to get medical care. Do not visit public areas. Get rest and stay hydrated. Take over-the-counter medicines, such as acetaminophen, to help you feel better. Stay in touch with your doctor. Separate yourself from other people.  As much  as possible, stay in a specific room and away from other people and pets in your home. If you test negative for COVID-19  You probably were not infected at the time your sample was collected.  However, that does not mean you will not get sick.  It is possible that you were very early in your infection when your sample was collected and that you could test positive later. A negative test result does not mean you won't get sick later. [employer]AmericaFlowers.co.uk 03/26/2019 This information is not intended to replace advice given to you by your health care provider. Make sure you discuss any questions you have with your health care provider. Document Revised: 09/29/2019 Document Reviewed: 09/29/2019 Elsevier Patient Education  2020 Elsevier Inc.  Pharyngitis  Pharyngitis is redness, pain, and swelling (inflammation) of the throat (pharynx). It is a very common cause of sore throat. Pharyngitis can be caused by a bacteria, but it is usually caused by a virus. Most cases of pharyngitis get better on their own without treatment. What are the causes? This condition may be caused by:  Infection by viruses (viral). Viral pharyngitis spreads from person to person (is contagious) through coughing, sneezing, and sharing of personal items or utensils such as cups, forks, spoons, and  toothbrushes.  Infection by bacteria (bacterial). Bacterial pharyngitis may be spread by touching the nose or face after coming in contact with the bacteria, or through more intimate contact, such as kissing.  Allergies. Allergies can cause buildup of mucus in the throat (post-nasal drip), leading to inflammation and irritation. Allergies can also cause blocked nasal passages, forcing breathing through the mouth, which dries and irritates the throat. What increases the risk? You are more likely to develop this condition if:  You are 19-59 years old.  You are exposed to crowded environments such as daycare, school, or dormitory living.  You live in a cold climate.  You have a weakened disease-fighting (immune) system. What are the signs or symptoms? Symptoms of this condition vary by the cause (viral, bacterial, or allergies) and can include:  Sore throat.  Fatigue.  Low-grade fever.  Headache.  Joint pain and muscle aches.  Skin rashes.  Swollen glands in the throat (lymph nodes).  Plaque-like film on the throat or tonsils. This is often a symptom of bacterial pharyngitis.  Vomiting.  Stuffy nose (nasal congestion).  Cough.  Red, itchy eyes (conjunctivitis).  Loss of appetite. How is this diagnosed? This condition is often diagnosed based on your medical history and a physical exam. Your health care provider will ask you questions about your illness and your symptoms. A swab of your throat may be done to check for bacteria (rapid strep test). Other lab tests may also be done, depending on the suspected cause, but these are rare. How is this treated? This condition usually gets better in 3-4 days without medicine. Bacterial pharyngitis may be treated with antibiotic medicines. Follow these instructions at home:  Take over-the-counter and prescription medicines only as told by your health care provider. ? If you were prescribed an antibiotic medicine, take it as told by  your health care provider. Do not stop taking the antibiotic even if you start to feel better. ? Do not give children aspirin because of the association with Reye syndrome.  Drink enough water and fluids to keep your urine clear or pale yellow.  Get a lot of rest.  Gargle with a salt-water mixture 3-4 times a day or as needed. To make a salt-water  mixture, completely dissolve -1 tsp of salt in 1 cup of warm water.  If your health care provider approves, you may use throat lozenges or sprays to soothe your throat. Contact a health care provider if:  You have large, tender lumps in your neck.  You have a rash.  You cough up green, yellow-brown, or bloody spit. Get help right away if:  Your neck becomes stiff.  You drool or are unable to swallow liquids.  You cannot drink or take medicines without vomiting.  You have severe pain that does not go away, even after you take medicine.  You have trouble breathing, and it is not caused by a stuffy nose.  You have new pain and swelling in your joints such as the knees, ankles, wrists, or elbows. Summary  Pharyngitis is redness, pain, and swelling (inflammation) of the throat (pharynx).  While pharyngitis can be caused by a bacteria, the most common causes are viral.  Most cases of pharyngitis get better on their own without treatment.  Bacterial pharyngitis is treated with antibiotic medicines. This information is not intended to replace advice given to you by your health care provider. Make sure you discuss any questions you have with your health care provider. Document Revised: 09/25/2017 Document Reviewed: 11/18/2016 Elsevier Patient Education  2020 [employer].

## 2020-07-06 NOTE — Progress Notes (Signed)
Subjective:    Patient ID: Kenneth Richard, male    DOB: 01/16/1986, 34 y.o.   MRN: [id number]  34y/o new patient male with new acute rhinitis/sore throat.  History fall seasonal allergies.  Flonase prn not using right now.  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  Calling clinic per [facility] staff covid policy for further guidance.  Day 1 of symptoms today.  Lives with another person but 2 bathrooms and bedrooms in home.  Denied fever/chills/loss of taste/smell, body aches, or n/v/d.  Patient consented to telephone visit.  This visit was conducted entirely via telephone/audio.  I spent 7 minutes on the telephone with patient.       Review of Systems  Constitutional: Negative for activity change, appetite change, chills, diaphoresis, fatigue and fever.  HENT: Positive for postnasal drip and sore throat. Negative for trouble swallowing and voice change.   Eyes: Negative for photophobia and visual disturbance.  Respiratory: Negative for cough, shortness of breath, wheezing and stridor.   Gastrointestinal: Negative for diarrhea, nausea and vomiting.  Musculoskeletal: Negative for arthralgias and myalgias.  Skin: Negative for rash.  Allergic/Immunologic: Positive for environmental allergies. Negative for food allergies.  Neurological: Negative for headaches.  Hematological: Negative for adenopathy.  Psychiatric/Behavioral: Negative for agitation, confusion and sleep disturbance.       Objective:   Physical Exam Constitutional:      General: He is awake. He is not in acute distress. HENT:     Right Ear: Hearing normal.     Left Ear: Hearing normal.     Nose: No congestion.     Mouth/Throat:     Mouth: No angioedema.  Eyes:     General: Vision grossly intact.  Neck:     Trachea: Trachea and phonation normal.  Pulmonary:     Effort: Pulmonary  effort is normal. No respiratory distress.     Breath sounds: Normal breath sounds and air entry. No stridor or transmitted upper airway sounds. No wheezing.     Comments: No cough audible during telephone conversation; spoke full sentences without difficulty; no nasal sniffing/throat clearing audible during telephone conversation Neurological:     Mental Status: He is alert and oriented to person, place, and time. Mental status is at baseline.  Psychiatric:        Attention and Perception: Attention and perception normal.        Mood and Affect: Mood normal.        Speech: Speech normal.        Behavior: Behavior normal. Behavior is cooperative.        Thought Content: Thought content normal.        Cognition and Memory: Cognition and memory normal.        Judgment: Judgment normal.           Assessment & Plan:  A-acute pharyngitis  P-history seasonal allergies. Post nasal drip irritation most likely cause of sore throat.  Ragweed season has started in local area.  Other viral illnesses including covid circulating in community. Patient may use normal saline nasal spray 2 sprays each nostril q2h wa as needed. flonase 1 spray each nostril BID OTC.  Patient denied personal or family history of ENT cancer.  OTC antihistamine of choice claritin/zyrtec 10mg  po daily.  Avoid triggers if possible.  Shower prior  to bedtime.  If allergic dust/dust mites recommend mattress/pillow covers/encasements; washing linens, vacuuming, sweeping, dusting weekly.  Call or return to clinic as needed if these symptoms worsen or fail to improve as anticipated.   [employer]care handout on sinus rinse, covid 19, covid 19 testing, covid FAQ, nonallergic rhinitis, and allergic rhinitis.  Patient verbalized understanding of instructions, agreed with plan of care and had no further questions at this time.  P2:  Avoidance and hand washing.  Scheduled for day 4 covid testing Monday 07/09/2020 at 1400.  May return to work as  wears KN95 mask, works outside, fully vaccinated, feels like normal allergies/post nasal drip denied other covid symptoms and no fever/chills/vomiting/diarrhea/body aches/loss of taste/smell.  Continue to wear mask when out in public and [place] policy to wear while at work.  Refer to MotionGlobe.de if further questions regarding covid prevention policies when on [employer].  Monitor for symptoms of covid e.g. runny nose, sore throat, headache, loss of taste/smell, nausea/vomiting/diarrhea, fever/chills, body aches, cough, shortness of breath/dyspnea.  Patient to notify clinic staff if new symptoms develop.  Discussed current research has shown that breakthrough covid infections are occurring with the delta variant but typically do not require hospitalization for fully vaccinated immunocompetent patients. Continue to protect self with mask wear, hand sanitizing washing, avoid touching face and maintaining social distancing 6 feet or greater. I recommended to quarantine at home until test results received next week.  [employer]care handout on covid 19, covid FAQ and covid testing.  Patient verbalized understanding of information/instructions, agreed with plan of care and had no further questions at this time.

## 2020-07-09 ENCOUNTER — Ambulatory Visit: Payer: 59 | Admitting: *Deleted

## 2020-07-09 ENCOUNTER — Other Ambulatory Visit: Payer: Self-pay

## 2020-07-09 DIAGNOSIS — Z20822 Contact with and (suspected) exposure to covid-19: Secondary | ICD-10-CM

## 2020-07-09 LAB — POC COVID19 BINAXNOW: SARS Coronavirus 2 Ag: NEGATIVE

## 2020-07-10 NOTE — Progress Notes (Signed)
[employer] rapid covid test negative.  Fully vaccinated may return to work as long as afebrile, no diarrhea vomiting in previous 24 hours.  Due to high spread of covid in community continue mask wear, social distancing and sanitizing high touch surfaces and following employer covid guidelines.  My chart message sent to patient.

## 2020-10-15 ENCOUNTER — Ambulatory Visit (INDEPENDENT_AMBULATORY_CARE_PROVIDER_SITE_OTHER): Payer: 59 | Admitting: Family Medicine

## 2020-10-15 ENCOUNTER — Other Ambulatory Visit: Payer: Self-pay

## 2020-10-15 ENCOUNTER — Encounter: Payer: Self-pay | Admitting: Family Medicine

## 2020-10-15 VITALS — BP 113/76 | HR 58 | Temp 98.0°F | Ht 67.5 in | Wt 181.4 lb

## 2020-10-15 DIAGNOSIS — Z0000 Encounter for general adult medical examination without abnormal findings: Secondary | ICD-10-CM

## 2020-10-15 DIAGNOSIS — Z23 Encounter for immunization: Secondary | ICD-10-CM | POA: Diagnosis not present

## 2020-10-15 LAB — URINALYSIS, ROUTINE W REFLEX MICROSCOPIC
Bilirubin, UA: NEGATIVE
Glucose, UA: NEGATIVE
Ketones, UA: NEGATIVE
Leukocytes,UA: NEGATIVE
Nitrite, UA: NEGATIVE
Protein,UA: NEGATIVE
Specific Gravity, UA: <1.005 — ABNORMAL LOW (ref 1.005–1.030)
Urobilinogen, Ur: 0.2 mg/dL (ref 0.2–1.0)
pH, UA: 5 (ref 5.0–7.5)

## 2020-10-15 LAB — MICROSCOPIC EXAMINATION
Bacteria, UA: NONE SEEN
WBC, UA: NONE SEEN /hpf (ref 0–5)

## 2020-10-15 NOTE — Patient Instructions (Signed)
Health Maintenance, Male Adopting a healthy lifestyle and getting preventive care are important in promoting health and wellness. Ask your health care provider about:  The right schedule for you to have regular tests and exams.  Things you can do on your own to prevent diseases and keep yourself healthy. What should I know about diet, weight, and exercise? Eat a healthy diet   Eat a diet that includes plenty of vegetables, fruits, low-fat dairy products, and lean protein.  Do not eat a lot of foods that are high in solid fats, added sugars, or sodium. Maintain a healthy weight Body mass index (BMI) is a measurement that can be used to identify possible weight problems. It estimates body fat based on height and weight. Your health care provider can help determine your BMI and help you achieve or maintain a healthy weight. Get regular exercise Get regular exercise. This is one of the most important things you can do for your health. Most adults should:  Exercise for at least 150 minutes each week. The exercise should increase your heart rate and make you sweat (moderate-intensity exercise).  Do strengthening exercises at least twice a week. This is in addition to the moderate-intensity exercise.  Spend less time sitting. Even light physical activity can be beneficial. Watch cholesterol and blood lipids Have your blood tested for lipids and cholesterol at 34 years of age, then have this test every 5 years. You may need to have your cholesterol levels checked more often if:  Your lipid or cholesterol levels are high.  You are older than 34 years of age.  You are at high risk for heart disease. What should I know about cancer screening? Many types of cancers can be detected early and may often be prevented. Depending on your health history and family history, you may need to have cancer screening at various ages. This may include screening for:  Colorectal cancer.  Prostate  cancer.  Skin cancer.  Lung cancer. What should I know about heart disease, diabetes, and high blood pressure? Blood pressure and heart disease  High blood pressure causes heart disease and increases the risk of stroke. This is more likely to develop in people who have high blood pressure readings, are of African descent, or are overweight.  Talk with your health care provider about your target blood pressure readings.  Have your blood pressure checked: ? Every 3-5 years if you are 18-39 years of age. ? Every year if you are 40 years old or older.  If you are between the ages of 65 and 75 and are a current or former smoker, ask your health care provider if you should have a one-time screening for abdominal aortic aneurysm (AAA). Diabetes Have regular diabetes screenings. This checks your fasting blood sugar level. Have the screening done:  Once every three years after age 45 if you are at a normal weight and have a low risk for diabetes.  More often and at a younger age if you are overweight or have a high risk for diabetes. What should I know about preventing infection? Hepatitis B If you have a higher risk for hepatitis B, you should be screened for this virus. Talk with your health care provider to find out if you are at risk for hepatitis B infection. Hepatitis C Blood testing is recommended for:  Everyone born from 1945 through 1965.  Anyone with known risk factors for hepatitis C. Sexually transmitted infections (STIs)  You should be screened each year   for STIs, including gonorrhea and chlamydia, if: ? You are sexually active and are younger than 34 years of age. ? You are older than 34 years of age and your health care provider tells you that you are at risk for this type of infection. ? Your sexual activity has changed since you were last screened, and you are at increased risk for chlamydia or gonorrhea. Ask your health care provider if you are at risk.  Ask your  health care provider about whether you are at high risk for HIV. Your health care provider may recommend a prescription medicine to help prevent HIV infection. If you choose to take medicine to prevent HIV, you should first get tested for HIV. You should then be tested every 3 months for as long as you are taking the medicine. Follow these instructions at home: Lifestyle  Do not use any products that contain nicotine or tobacco, such as cigarettes, e-cigarettes, and chewing tobacco. If you need help quitting, ask your health care provider.  Do not use street drugs.  Do not share needles.  Ask your health care provider for help if you need support or information about quitting drugs. Alcohol use  Do not drink alcohol if your health care provider tells you not to drink.  If you drink alcohol: ? Limit how much you have to 0-2 drinks a day. ? Be aware of how much alcohol is in your drink. In the [place] one drink equals one 12 oz bottle of beer (355 mL), one 5 oz glass of wine (148 mL), or one 1 oz glass of hard liquor (44 mL). General instructions  Schedule regular health, dental, and eye exams.  Stay current with your vaccines.  Tell your health care provider if: ? You often feel depressed. ? You have ever been abused or do not feel safe at home. Summary  Adopting a healthy lifestyle and getting preventive care are important in promoting health and wellness.  Follow your health care provider's instructions about healthy diet, exercising, and getting tested or screened for diseases.  Follow your health care provider's instructions on monitoring your cholesterol and blood pressure. This information is not intended to replace advice given to you by your health care provider. Make sure you discuss any questions you have with your health care provider. Document Revised: 10/06/2018 Document Reviewed: 10/06/2018 Elsevier Patient Education  2020 [employer] Influenza (Flu) Vaccine  (Inactivated or Recombinant): What You Need to Know 1. Why get vaccinated? Influenza vaccine can prevent influenza (flu). Flu is a contagious disease that spreads around the [place] every year, usually between October and May. Anyone can get the flu, but it is more dangerous for some people. Infants and young children, people 65 years of age and older, pregnant women, and people with certain health conditions or a weakened immune system are at greatest risk of flu complications. Pneumonia, bronchitis, sinus infections and ear infections are examples of flu-related complications. If you have a medical condition, such as heart disease, cancer or diabetes, flu can make it worse. Flu can cause fever and chills, sore throat, muscle aches, fatigue, cough, headache, and runny or stuffy nose. Some people may have vomiting and diarrhea, though this is more common in children than adults. Each year thousands of people in the [place] die from flu, and many more are hospitalized. Flu vaccine prevents millions of illnesses and flu-related visits to the doctor each year. 2. Influenza vaccine CDC recommends everyone 6 months of age and   older get vaccinated every flu season. Children 6 months through 8 years of age may need 2 doses during a single flu season. Everyone else needs only 1 dose each flu season. It takes about 2 weeks for protection to develop after vaccination. There are many flu viruses, and they are always changing. Each year a new flu vaccine is made to protect against three or four viruses that are likely to cause disease in the upcoming flu season. Even when the vaccine doesn't exactly match these viruses, it may still provide some protection. Influenza vaccine does not cause flu. Influenza vaccine may be given at the same time as other vaccines. 3. Talk with your health care provider Tell your vaccine provider if the person getting the vaccine:  Has had an allergic reaction after a  previous dose of influenza vaccine, or has any severe, life-threatening allergies.  Has ever had Guillain-Barr Syndrome (also called GBS). In some cases, your health care provider may decide to postpone influenza vaccination to a future visit. People with minor illnesses, such as a cold, may be vaccinated. People who are moderately or severely ill should usually wait until they recover before getting influenza vaccine. Your health care provider can give you more information. 4. Risks of a vaccine reaction  Soreness, redness, and swelling where shot is given, fever, muscle aches, and headache can happen after influenza vaccine.  There may be a very small increased risk of Guillain-Barr Syndrome (GBS) after inactivated influenza vaccine (the flu shot). Young children who get the flu shot along with pneumococcal vaccine (PCV13), and/or DTaP vaccine at the same time might be slightly more likely to have a seizure caused by fever. Tell your health care provider if a child who is getting flu vaccine has ever had a seizure. People sometimes faint after medical procedures, including vaccination. Tell your provider if you feel dizzy or have vision changes or ringing in the ears. As with any medicine, there is a very remote chance of a vaccine causing a severe allergic reaction, other serious injury, or death. 5. What if there is a serious problem? An allergic reaction could occur after the vaccinated person leaves the clinic. If you see signs of a severe allergic reaction (hives, swelling of the face and throat, difficulty breathing, a fast heartbeat, dizziness, or weakness), call 9-1-1 and get the person to the nearest hospital. For other signs that concern you, call your health care provider. Adverse reactions should be reported to the Vaccine Adverse Event Reporting System (VAERS). Your health care provider will usually file this report, or you can do it yourself. Visit the VAERS website at  www.vaers.hhs.gov or call 1-800-822-7967.VAERS is only for reporting reactions, and VAERS staff do not give medical advice. 6. The National Vaccine Injury Compensation Program The National Vaccine Injury Compensation Program (VICP) is a federal program that was created to compensate people who may have been injured by certain vaccines. Visit the VICP website at www.hrsa.gov/vaccinecompensation or call 1-800-338-2382 to learn about the program and about filing a claim. There is a time limit to file a claim for compensation. 7. How can I learn more?  Ask your healthcare provider.  Call your local or state health department.  Contact the Centers for Disease Control and Prevention (CDC): ? Call 1-800-232-4636 (1-800-CDC-INFO) or ? Visit CDC's www.cdc.gov/flu Vaccine Information Statement (Interim) Inactivated Influenza Vaccine (06/10/2018) This information is not intended to replace advice given to you by your health care provider. Make sure you discuss any questions you   have with your health care provider. Document Revised: 02/01/2019 Document Reviewed: 06/14/2018 Elsevier Patient Education  2020 Elsevier Inc.  

## 2020-10-15 NOTE — Progress Notes (Signed)
BP 113/76   Pulse (!) 58   Temp 98 F (36.7 C)   Ht 5' 7.5" (1.715 m)   Wt 181 lb 6.4 oz (82.3 kg)   SpO2 99%   BMI 27.99 kg/m    Subjective:    Patient ID: Kenneth Richard, male    DOB: [birth year], 34 y.o.   MRN: [id number]  HPI: Kenneth Richard is a 34 y.o. male presenting on 10/15/2020 for comprehensive medical examination. Current medical complaints include:none  He currently lives with: wife Interim Problems from his last visit: no  Depression Screen done today and results listed below:  Depression screen [facility] 2/9 10/15/2020 10/13/2019 10/11/2018 08/13/2018  Decreased Interest 0 0 0 0  Down, Depressed, Hopeless 0 0 0 0  PHQ - 2 Score 0 0 0 0  Altered sleeping - 1 0 1  Tired, decreased energy - 0 0 0  Change in appetite - 1 0 0  Feeling bad or failure about yourself  - 0 0 0  Trouble concentrating - 0 0 2  Moving slowly or fidgety/restless - 2 0 2  Suicidal thoughts - 0 0 0  PHQ-9 Score - 4 0 5  Difficult doing work/chores - - - Not difficult at all    Past Medical History:  Past Medical History:  Diagnosis Date  . Anxiety   . Broken bones   . Depression     Surgical History:  Past Surgical History:  Procedure Laterality Date  . MOUTH SURGERY      Medications:  No current outpatient medications on file prior to visit.   No current facility-administered medications on file prior to visit.    Allergies:  No Known Allergies  Social History:  Social History   Socioeconomic History  . Marital status: Married    Spouse name: Not on file  . Number of children: Not on file  . Years of education: Not on file  . Highest education level: Not on file  Occupational History  . Not on file  Tobacco Use  . Smoking status: Never Smoker  . Smokeless tobacco: Never Used  Vaping Use  . Vaping Use: Never used  Substance and Sexual Activity  . Alcohol use: Yes    Alcohol/week: 8.0 standard drinks    Types: 8 Glasses of wine per week  . Drug use: Not Currently   . Sexual activity: Yes  Other Topics Concern  . Not on file  Social History Narrative  . Not on file   Social Determinants of Health   Financial Resource Strain: Not on file  Food Insecurity: Not on file  Transportation Needs: Not on file  Physical Activity: Not on file  Stress: Not on file  Social Connections: Not on file  Intimate Partner Violence: Not on file   Social History   Tobacco Use  Smoking Status Never Smoker  Smokeless Tobacco Never Used   Social History   Substance and Sexual Activity  Alcohol Use Yes  . Alcohol/week: 8.0 standard drinks  . Types: 8 Glasses of wine per week    Family History:  Family History  Problem Relation Age of Onset  . Anxiety disorder Mother   . Depression Mother   . Anxiety disorder Father   . Depression Father   . Cowden syndrome Brother   . Breast cancer Maternal Grandmother   . Leukemia Maternal Grandfather   . Multiple myeloma Maternal Grandfather   . Alzheimer's disease Paternal Grandfather   . Parkinson's disease Paternal Uncle  Past medical history, surgical history, medications, allergies, family history and social history reviewed with patient today and changes made to appropriate areas of the chart.   Review of Systems  Constitutional: Negative.   HENT: Positive for tinnitus. Negative for congestion, ear discharge, ear pain, hearing loss, nosebleeds, sinus pain and sore throat.   Eyes: Negative.   Respiratory: Negative.  Negative for stridor.   Cardiovascular: Negative.   Gastrointestinal: Negative.   Genitourinary: Negative.   Musculoskeletal: Negative.   Skin: Negative.   Neurological: Negative.   Endo/Heme/Allergies: Negative.   Psychiatric/Behavioral: Negative for depression, hallucinations, memory loss, substance abuse and suicidal ideas. The patient is nervous/anxious and has insomnia.     All other ROS negative except what is listed above and in the HPI.      Objective:    BP 113/76    Pulse (!) 58   Temp 98 F (36.7 C)   Ht 5' 7.5" (1.715 m)   Wt 181 lb 6.4 oz (82.3 kg)   SpO2 99%   BMI 27.99 kg/m   Wt Readings from Last 3 Encounters:  10/15/20 181 lb 6.4 oz (82.3 kg)  10/13/19 170 lb 12.8 oz (77.5 kg)  10/11/18 169 lb 9.6 oz (76.9 kg)    Physical Exam Vitals and nursing note reviewed.  Constitutional:      General: He is not in acute distress.    Appearance: Normal appearance. He is obese. He is not ill-appearing, toxic-appearing or diaphoretic.  HENT:     Head: Normocephalic and atraumatic.     Right Ear: Tympanic membrane, ear canal and external ear normal. There is no impacted cerumen.     Left Ear: Tympanic membrane, ear canal and external ear normal. There is no impacted cerumen.     Nose: Nose normal. No congestion or rhinorrhea.     Mouth/Throat:     Mouth: Mucous membranes are moist.     Pharynx: Oropharynx is clear. No oropharyngeal exudate or posterior oropharyngeal erythema.  Eyes:     General: No scleral icterus.       Right eye: No discharge.        Left eye: No discharge.     Extraocular Movements: Extraocular movements intact.     Conjunctiva/sclera: Conjunctivae normal.     Pupils: Pupils are equal, round, and reactive to light.  Neck:     Vascular: No carotid bruit.  Cardiovascular:     Rate and Rhythm: Normal rate and regular rhythm.     Pulses: Normal pulses.     Heart sounds: No murmur heard. No friction rub. No gallop.   Pulmonary:     Effort: Pulmonary effort is normal. No respiratory distress.     Breath sounds: Normal breath sounds. No stridor. No wheezing, rhonchi or rales.  Chest:     Chest wall: No tenderness.  Abdominal:     General: Abdomen is flat. Bowel sounds are normal. There is no distension.     Palpations: Abdomen is soft. There is no mass.     Tenderness: There is no abdominal tenderness. There is no right CVA tenderness, left CVA tenderness, guarding or rebound.     Hernia: No hernia is present.   Genitourinary:    Comments: Genital exam deferred with shared decision making Musculoskeletal:        General: No swelling, tenderness, deformity or signs of injury.     Cervical back: Normal range of motion and neck supple. No rigidity. No muscular tenderness.  Right lower leg: No edema.     Left lower leg: No edema.  Lymphadenopathy:     Cervical: No cervical adenopathy.  Skin:    General: Skin is warm and dry.     Capillary Refill: Capillary refill takes less than 2 seconds.     Coloration: Skin is not jaundiced or pale.     Findings: No bruising, erythema, lesion or rash.  Neurological:     General: No focal deficit present.     Mental Status: He is alert and oriented to person, place, and time.     Cranial Nerves: No cranial nerve deficit.     Sensory: No sensory deficit.     Motor: No weakness.     Coordination: Coordination normal.     Gait: Gait normal.     Deep Tendon Reflexes: Reflexes normal.  Psychiatric:        Mood and Affect: Mood normal.        Behavior: Behavior normal.        Thought Content: Thought content normal.        Judgment: Judgment normal.     Results for orders placed or performed in visit on 07/09/20  POC COVID-19  Result Value Ref Range   SARS Coronavirus 2 Ag Negative Negative      Assessment & Plan:   Problem List Items Addressed This Visit   None   Visit Diagnoses    Routine general medical examination at a health care facility    -  Primary   Vaccines up to date. Screening labs checked today. Continue diet and exercise. Call with any concerns.   Relevant Orders   CBC with Differential/Platelet   Comprehensive metabolic panel   Lipid Panel w/o Chol/HDL Ratio   TSH   Urinalysis, Routine w reflex microscopic   Need for influenza vaccination       Flu shot given today.   Relevant Orders   Flu Vaccine QUAD 6+ mos PF IM (Fluarix Quad PF) (Completed)       LABORATORY TESTING:  Health maintenance labs ordered today as  discussed above.   IMMUNIZATIONS:   - Tdap: Tetanus vaccination status reviewed: last tetanus booster within 10 years. - Influenza: Up to date - Pneumovax: Refused - COVID: Up to date  PATIENT COUNSELING:    Sexuality: Discussed sexually transmitted diseases, partner selection, use of condoms, avoidance of unintended pregnancy  and contraceptive alternatives.   Advised to avoid cigarette smoking.  I discussed with the patient that most people either abstain from alcohol or drink within safe limits (<=14/week and <=4 drinks/occasion for males, <=7/weeks and <= 3 drinks/occasion for females) and that the risk for alcohol disorders and other health effects rises proportionally with the number of drinks per week and how often a drinker exceeds daily limits.  Discussed cessation/primary prevention of drug use and availability of treatment for abuse.   Diet: Encouraged to adjust caloric intake to maintain  or achieve ideal body weight, to reduce intake of dietary saturated fat and total fat, to limit sodium intake by avoiding high sodium foods and not adding table salt, and to maintain adequate dietary potassium and calcium preferably from fresh fruits, vegetables, and low-fat dairy products.    stressed the importance of regular exercise  Injury prevention: Discussed safety belts, safety helmets, smoke detector, smoking near bedding or upholstery.   Dental health: Discussed importance of regular tooth brushing, flossing, and dental visits.   Follow up plan: NEXT PREVENTATIVE PHYSICAL DUE IN  1 YEAR. Return in about 1 year (around 10/15/2021) for physical.

## 2020-10-16 LAB — CBC WITH DIFFERENTIAL/PLATELET
Basophils Absolute: 0.1 10*3/uL (ref 0.0–0.2)
Basos: 1 %
EOS (ABSOLUTE): 0.4 10*3/uL (ref 0.0–0.4)
Eos: 6 %
Hematocrit: 41.4 % (ref 37.5–51.0)
Hemoglobin: 14 g/dL (ref 13.0–17.7)
Immature Grans (Abs): 0 10*3/uL (ref 0.0–0.1)
Immature Granulocytes: 0 %
Lymphocytes Absolute: 1.6 10*3/uL (ref 0.7–3.1)
Lymphs: 24 %
MCH: 32 pg (ref 26.6–33.0)
MCHC: 33.8 g/dL (ref 31.5–35.7)
MCV: 95 fL (ref 79–97)
Monocytes Absolute: 0.5 10*3/uL (ref 0.1–0.9)
Monocytes: 7 %
Neutrophils Absolute: 4.1 10*3/uL (ref 1.4–7.0)
Neutrophils: 62 %
Platelets: 267 10*3/uL (ref 150–450)
RBC: 4.37 x10E6/uL (ref 4.14–5.80)
RDW: 12.6 % (ref 11.6–15.4)
WBC: 6.6 10*3/uL (ref 3.4–10.8)

## 2020-10-16 LAB — COMPREHENSIVE METABOLIC PANEL
ALT: 21 IU/L (ref 0–44)
AST: 19 IU/L (ref 0–40)
Albumin/Globulin Ratio: 2.1 (ref 1.2–2.2)
Albumin: 4.4 g/dL (ref 4.0–5.0)
Alkaline Phosphatase: 60 IU/L (ref 44–121)
BUN/Creatinine Ratio: 19 (ref 9–20)
BUN: 12 mg/dL (ref 6–20)
Bilirubin Total: 0.5 mg/dL (ref 0.0–1.2)
CO2: 24 mmol/L (ref 20–29)
Calcium: 8.6 mg/dL — ABNORMAL LOW (ref 8.7–10.2)
Chloride: 105 mmol/L (ref 96–106)
Creatinine, Ser: 0.64 mg/dL — ABNORMAL LOW (ref 0.76–1.27)
GFR calc Af Amer: 148 mL/min/{1.73_m2} (ref >59)
GFR calc non Af Amer: 128 mL/min/{1.73_m2} (ref >59)
Globulin, Total: 2.1 g/dL (ref 1.5–4.5)
Glucose: 93 mg/dL (ref 65–99)
Potassium: 4.3 mmol/L (ref 3.5–5.2)
Sodium: 141 mmol/L (ref 134–144)
Total Protein: 6.5 g/dL (ref 6.0–8.5)

## 2020-10-16 LAB — LIPID PANEL W/O CHOL/HDL RATIO
Cholesterol, Total: 157 mg/dL (ref 100–199)
HDL: 49 mg/dL (ref >39)
LDL Chol Calc (NIH): 82 mg/dL (ref 0–99)
Triglycerides: 153 mg/dL — ABNORMAL HIGH (ref 0–149)
VLDL Cholesterol Cal: 26 mg/dL (ref 5–40)

## 2020-10-16 LAB — TSH: TSH: 2.13 u[IU]/mL (ref 0.450–4.500)

## 2020-11-21 DIAGNOSIS — Z131 Encounter for screening for diabetes mellitus: Secondary | ICD-10-CM | POA: Diagnosis not present

## 2020-11-21 DIAGNOSIS — N939 Abnormal uterine and vaginal bleeding, unspecified: Secondary | ICD-10-CM | POA: Diagnosis not present

## 2020-11-21 DIAGNOSIS — N911 Secondary amenorrhea: Secondary | ICD-10-CM | POA: Diagnosis not present

## 2020-11-21 DIAGNOSIS — Z01419 Encounter for gynecological examination (general) (routine) without abnormal findings: Secondary | ICD-10-CM | POA: Diagnosis not present

## 2021-05-23 DIAGNOSIS — N939 Abnormal uterine and vaginal bleeding, unspecified: Secondary | ICD-10-CM | POA: Diagnosis not present

## 2021-05-23 DIAGNOSIS — N911 Secondary amenorrhea: Secondary | ICD-10-CM | POA: Diagnosis not present

## 2021-05-24 DIAGNOSIS — Z13220 Encounter for screening for lipoid disorders: Secondary | ICD-10-CM | POA: Diagnosis not present

## 2021-05-24 DIAGNOSIS — N911 Secondary amenorrhea: Secondary | ICD-10-CM | POA: Diagnosis not present

## 2021-05-24 DIAGNOSIS — Z131 Encounter for screening for diabetes mellitus: Secondary | ICD-10-CM | POA: Diagnosis not present

## 2021-05-24 DIAGNOSIS — N939 Abnormal uterine and vaginal bleeding, unspecified: Secondary | ICD-10-CM | POA: Diagnosis not present

## 2021-07-10 DIAGNOSIS — N911 Secondary amenorrhea: Secondary | ICD-10-CM | POA: Diagnosis not present

## 2021-07-10 DIAGNOSIS — N939 Abnormal uterine and vaginal bleeding, unspecified: Secondary | ICD-10-CM | POA: Diagnosis not present

## 2021-07-10 DIAGNOSIS — R7401 Elevation of levels of liver transaminase levels: Secondary | ICD-10-CM | POA: Diagnosis not present

## 2021-07-10 DIAGNOSIS — Z3169 Encounter for other general counseling and advice on procreation: Secondary | ICD-10-CM | POA: Diagnosis not present

## 2021-10-07 ENCOUNTER — Other Ambulatory Visit: Payer: Self-pay

## 2021-10-07 ENCOUNTER — Ambulatory Visit (INDEPENDENT_AMBULATORY_CARE_PROVIDER_SITE_OTHER): Payer: BC Managed Care – PPO | Admitting: Family Medicine

## 2021-10-07 ENCOUNTER — Encounter: Payer: Self-pay | Admitting: Family Medicine

## 2021-10-07 VITALS — BP 115/71 | HR 59 | Temp 98.7°F | Ht 68.0 in | Wt 194.4 lb

## 2021-10-07 DIAGNOSIS — Z0000 Encounter for general adult medical examination without abnormal findings: Secondary | ICD-10-CM

## 2021-10-07 DIAGNOSIS — D519 Vitamin B12 deficiency anemia, unspecified: Secondary | ICD-10-CM | POA: Diagnosis not present

## 2021-10-07 DIAGNOSIS — Z23 Encounter for immunization: Secondary | ICD-10-CM | POA: Diagnosis not present

## 2021-10-07 DIAGNOSIS — L608 Other nail disorders: Secondary | ICD-10-CM

## 2021-10-07 DIAGNOSIS — Z136 Encounter for screening for cardiovascular disorders: Secondary | ICD-10-CM | POA: Diagnosis not present

## 2021-10-07 DIAGNOSIS — E559 Vitamin D deficiency, unspecified: Secondary | ICD-10-CM | POA: Diagnosis not present

## 2021-10-07 LAB — URINALYSIS, ROUTINE W REFLEX MICROSCOPIC
Bilirubin, UA: NEGATIVE
Glucose, UA: NEGATIVE
Ketones, UA: NEGATIVE
Leukocytes,UA: NEGATIVE
Nitrite, UA: NEGATIVE
RBC, UA: NEGATIVE
Specific Gravity, UA: 1.02 (ref 1.005–1.030)
Urobilinogen, Ur: 0.2 mg/dL (ref 0.2–1.0)
pH, UA: 7 (ref 5.0–7.5)

## 2021-10-07 NOTE — Progress Notes (Signed)
BP 115/71   Pulse (!) 59   Temp 98.7 F (37.1 C)   Ht $R'5\' 8"'sd$  (1.727 m)   Wt 194 lb 6.4 oz (88.2 kg)   SpO2 98%   BMI 29.56 kg/m    Subjective:    Patient ID: Kenneth Richard, male    DOB: [birth year], 35 y.o.   MRN: [id number] HPI: Kenneth Richard is a 35 y.o. male presenting on 10/07/2021 for comprehensive medical examination. Current medical complaints include:  Anxiety has been a little bit worse. Has been considering needing some medicine if things don't come down in the new year. He has moved and got a dog and changed jobs.   He currently lives with: wife Interim Problems from his last visit: no  Depression Screen done today and results listed below:  Depression screen [facility] 2/9 10/07/2021 10/15/2020 10/13/2019 10/11/2018 08/13/2018  Decreased Interest 0 0 0 0 0  Down, Depressed, Hopeless 0 0 0 0 0  PHQ - 2 Score 0 0 0 0 0  Altered sleeping 0 - 1 0 1  Tired, decreased energy 0 - 0 0 0  Change in appetite 0 - 1 0 0  Feeling bad or failure about yourself  1 - 0 0 0  Trouble concentrating 0 - 0 0 2  Moving slowly or fidgety/restless 1 - 2 0 2  Suicidal thoughts 1 - 0 0 0  PHQ-9 Score 3 - 4 0 5  Difficult doing work/chores - - - - Not difficult at all    Past Medical History:  Past Medical History:  Diagnosis Date   Anxiety    Broken bones    Depression     Surgical History:  Past Surgical History:  Procedure Laterality Date   MOUTH SURGERY      Medications:  No current outpatient medications on file prior to visit.   No current facility-administered medications on file prior to visit.    Allergies:  No Known Allergies  Social History:  Social History   Socioeconomic History   Marital status: Married    Spouse name: Not on file   Number of children: Not on file   Years of education: Not on file   Highest education level: Not on file  Occupational History   Not on file  Tobacco Use   Smoking status: Never   Smokeless tobacco: Never  Vaping Use    Vaping Use: Never used  Substance and Sexual Activity   Alcohol use: Yes    Alcohol/week: 8.0 standard drinks    Types: 8 Glasses of wine per week   Drug use: Not Currently   Sexual activity: Yes  Other Topics Concern   Not on file  Social History Narrative   Not on file   Social Determinants of Health   Financial Resource Strain: Not on file  Food Insecurity: Not on file  Transportation Needs: Not on file  Physical Activity: Not on file  Stress: Not on file  Social Connections: Not on file  Intimate Partner Violence: Not on file   Social History   Tobacco Use  Smoking Status Never  Smokeless Tobacco Never   Social History   Substance and Sexual Activity  Alcohol Use Yes   Alcohol/week: 8.0 standard drinks   Types: 8 Glasses of wine per week    Family History:  Family History  Problem Relation Age of Onset   Anxiety disorder Mother    Depression Mother    Anxiety disorder Father  Depression Father    Cowden syndrome Brother    Breast cancer Maternal Grandmother    Leukemia Maternal Grandfather    Multiple myeloma Maternal Grandfather    Alzheimer's disease Paternal Grandfather    Parkinson's disease Paternal Uncle     Past medical history, surgical history, medications, allergies, family history and social history reviewed with patient today and changes made to appropriate areas of the chart.   Review of Systems  Constitutional: Negative.   HENT:  Positive for tinnitus. Negative for congestion, ear discharge, ear pain, hearing loss, nosebleeds, sinus pain and sore throat.   Eyes: Negative.   Respiratory: Negative.  Negative for stridor.   Cardiovascular: Negative.   Gastrointestinal:  Positive for constipation. Negative for abdominal pain, blood in stool, diarrhea, heartburn, melena, nausea and vomiting.  Genitourinary: Negative.   Musculoskeletal: Negative.   Skin: Negative.   Neurological: Negative.   Endo/Heme/Allergies:  Positive for environmental  allergies. Negative for polydipsia. Does not bruise/bleed easily.  Psychiatric/Behavioral:  Negative for depression, hallucinations, memory loss, substance abuse and suicidal ideas. The patient is nervous/anxious. The patient does not have insomnia.   All other ROS negative except what is listed above and in the HPI.      Objective:    BP 115/71   Pulse (!) 59   Temp 98.7 F (37.1 C)   Ht $R'5\' 8"'Lw$  (1.727 m)   Wt 194 lb 6.4 oz (88.2 kg)   SpO2 98%   BMI 29.56 kg/m   Wt Readings from Last 3 Encounters:  10/07/21 194 lb 6.4 oz (88.2 kg)  10/15/20 181 lb 6.4 oz (82.3 kg)  10/13/19 170 lb 12.8 oz (77.5 kg)    Physical Exam Vitals and nursing note reviewed.  Constitutional:      General: He is not in acute distress.    Appearance: Normal appearance. He is obese. He is not ill-appearing, toxic-appearing or diaphoretic.  HENT:     Head: Normocephalic and atraumatic.     Right Ear: Tympanic membrane, ear canal and external ear normal. There is no impacted cerumen.     Left Ear: Tympanic membrane, ear canal and external ear normal. There is no impacted cerumen.     Nose: Nose normal. No congestion or rhinorrhea.     Mouth/Throat:     Mouth: Mucous membranes are moist.     Pharynx: Oropharynx is clear. No oropharyngeal exudate or posterior oropharyngeal erythema.  Eyes:     General: No scleral icterus.       Right eye: No discharge.        Left eye: No discharge.     Extraocular Movements: Extraocular movements intact.     Conjunctiva/sclera: Conjunctivae normal.     Pupils: Pupils are equal, round, and reactive to light.  Neck:     Vascular: No carotid bruit.  Cardiovascular:     Rate and Rhythm: Normal rate and regular rhythm.     Pulses: Normal pulses.     Heart sounds: No murmur heard.   No friction rub. No gallop.  Pulmonary:     Effort: Pulmonary effort is normal. No respiratory distress.     Breath sounds: Normal breath sounds. No stridor. No wheezing, rhonchi or rales.   Chest:     Chest wall: No tenderness.  Abdominal:     General: Abdomen is flat. Bowel sounds are normal. There is no distension.     Palpations: Abdomen is soft. There is no mass.     Tenderness: There is  no abdominal tenderness. There is no right CVA tenderness, left CVA tenderness, guarding or rebound.     Hernia: No hernia is present.  Genitourinary:    Comments: Genital exam deferred with shared decision making Musculoskeletal:        General: No swelling, tenderness, deformity or signs of injury.     Cervical back: Normal range of motion and neck supple. No rigidity. No muscular tenderness.     Right lower leg: No edema.     Left lower leg: No edema.  Lymphadenopathy:     Cervical: No cervical adenopathy.  Skin:    General: Skin is warm and dry.     Capillary Refill: Capillary refill takes less than 2 seconds.     Coloration: Skin is not jaundiced or pale.     Findings: No bruising, erythema, lesion or rash.  Neurological:     General: No focal deficit present.     Mental Status: He is alert and oriented to person, place, and time.     Cranial Nerves: No cranial nerve deficit.     Sensory: No sensory deficit.     Motor: No weakness.     Coordination: Coordination normal.     Gait: Gait normal.     Deep Tendon Reflexes: Reflexes normal.  Psychiatric:        Mood and Affect: Mood normal.        Behavior: Behavior normal.        Thought Content: Thought content normal.        Judgment: Judgment normal.    Results for orders placed or performed in visit on 10/15/20  Microscopic Examination   Urine  Result Value Ref Range   WBC, UA None seen 0 - 5 /hpf   RBC 0-2 0 - 2 /hpf   Epithelial Cells (non renal) 0-10 0 - 10 /hpf   Bacteria, UA None seen None seen/Few  CBC with Differential/Platelet  Result Value Ref Range   WBC 6.6 3.4 - 10.8 x10E3/uL   RBC 4.37 4.14 - 5.80 x10E6/uL   Hemoglobin 14.0 13.0 - 17.7 g/dL   Hematocrit 41.4 37.5 - 51.0 %   MCV 95 79 - 97 fL    MCH 32.0 26.6 - 33.0 pg   MCHC 33.8 31.5 - 35.7 g/dL   RDW 12.6 11.6 - 15.4 %   Platelets 267 150 - 450 x10E3/uL   Neutrophils 62 Not Estab. %   Lymphs 24 Not Estab. %   Monocytes 7 Not Estab. %   Eos 6 Not Estab. %   Basos 1 Not Estab. %   Neutrophils Absolute 4.1 1.4 - 7.0 x10E3/uL   Lymphocytes Absolute 1.6 0.7 - 3.1 x10E3/uL   Monocytes Absolute 0.5 0.1 - 0.9 x10E3/uL   EOS (ABSOLUTE) 0.4 0.0 - 0.4 x10E3/uL   Basophils Absolute 0.1 0.0 - 0.2 x10E3/uL   Immature Granulocytes 0 Not Estab. %   Immature Grans (Abs) 0.0 0.0 - 0.1 x10E3/uL  Comprehensive metabolic panel  Result Value Ref Range   Glucose 93 65 - 99 mg/dL   BUN 12 6 - 20 mg/dL   Creatinine, Ser 0.64 (L) 0.76 - 1.27 mg/dL   GFR calc non Af Amer 128 >59 mL/min/1.73   GFR calc Af Amer 148 >59 mL/min/1.73   BUN/Creatinine Ratio 19 9 - 20   Sodium 141 134 - 144 mmol/L   Potassium 4.3 3.5 - 5.2 mmol/L   Chloride 105 96 - 106 mmol/L   CO2 24 20 -  29 mmol/L   Calcium 8.6 (L) 8.7 - 10.2 mg/dL   Total Protein 6.5 6.0 - 8.5 g/dL   Albumin 4.4 4.0 - 5.0 g/dL   Globulin, Total 2.1 1.5 - 4.5 g/dL   Albumin/Globulin Ratio 2.1 1.2 - 2.2   Bilirubin Total 0.5 0.0 - 1.2 mg/dL   Alkaline Phosphatase 60 44 - 121 IU/L   AST 19 0 - 40 IU/L   ALT 21 0 - 44 IU/L  Lipid Panel w/o Chol/HDL Ratio  Result Value Ref Range   Cholesterol, Total 157 100 - 199 mg/dL   Triglycerides 153 (H) 0 - 149 mg/dL   HDL 49 >39 mg/dL   VLDL Cholesterol Cal 26 5 - 40 mg/dL   LDL Chol Calc (NIH) 82 0 - 99 mg/dL  TSH  Result Value Ref Range   TSH 2.130 0.450 - 4.500 uIU/mL  Urinalysis, Routine w reflex microscopic  Result Value Ref Range   Specific Gravity, UA <1.005 (L) 1.005 - 1.030   pH, UA 5.0 5.0 - 7.5   Color, UA Yellow Yellow   Appearance Ur Clear Clear   Leukocytes,UA Negative Negative   Protein,UA Negative Negative/Trace   Glucose, UA Negative Negative   Ketones, UA Negative Negative   RBC, UA Trace (A) Negative   Bilirubin, UA  Negative Negative   Urobilinogen, Ur 0.2 0.2 - 1.0 mg/dL   Nitrite, UA Negative Negative   Microscopic Examination See below:       Assessment & Plan:   Problem List Items Addressed This Visit   None Visit Diagnoses     Routine general medical examination at a health care facility    -  Primary   Vaccines up to date. Screening labs checked today. Continue diet and exercise. Call with any concerns.    Relevant Orders   Comprehensive metabolic panel   CBC with Differential/Platelet   Lipid Panel w/o Chol/HDL Ratio   TSH   Urinalysis, Routine w reflex microscopic   Change in nail appearance       Will check for vitamin deficiencies and labs. Await results. Treat as needed.    Relevant Orders   B12   VITAMIN D 25 Hydroxy (Vit-D Deficiency, Fractures)   Need for influenza vaccination       Relevant Orders   Flu Vaccine QUAD 6+ mos PF IM (Fluarix Quad PF) (Completed)       LABORATORY TESTING:  Health maintenance labs ordered today as discussed above.   IMMUNIZATIONS:   - Tdap: Tetanus vaccination status reviewed: last tetanus booster within 10 years. - Influenza: Administered today - Pneumovax: Not applicable - Prevnar: Not applicable - COVID: Up to date - HPV: Not applicable - Shingrix vaccine: Not applicable  PATIENT COUNSELING:    Sexuality: Discussed sexually transmitted diseases, partner selection, use of condoms, avoidance of unintended pregnancy  and contraceptive alternatives.   Advised to avoid cigarette smoking.  I discussed with the patient that most people either abstain from alcohol or drink within safe limits (<=14/week and <=4 drinks/occasion for males, <=7/weeks and <= 3 drinks/occasion for females) and that the risk for alcohol disorders and other health effects rises proportionally with the number of drinks per week and how often a drinker exceeds daily limits.  Discussed cessation/primary prevention of drug use and availability of treatment for abuse.    Diet: Encouraged to adjust caloric intake to maintain  or achieve ideal body weight, to reduce intake of dietary saturated fat and total fat, to limit sodium  intake by avoiding high sodium foods and not adding table salt, and to maintain adequate dietary potassium and calcium preferably from fresh fruits, vegetables, and low-fat dairy products.    stressed the importance of regular exercise  Injury prevention: Discussed safety belts, safety helmets, smoke detector, smoking near bedding or upholstery.   Dental health: Discussed importance of regular tooth brushing, flossing, and dental visits.   Follow up plan: NEXT PREVENTATIVE PHYSICAL DUE IN 1 YEAR. Return in about 1 year (around 10/07/2022), or physical.

## 2021-10-08 ENCOUNTER — Encounter: Payer: Self-pay | Admitting: Family Medicine

## 2021-10-08 LAB — CBC WITH DIFFERENTIAL/PLATELET
Basophils Absolute: 0 10*3/uL (ref 0.0–0.2)
Basos: 1 %
EOS (ABSOLUTE): 0.1 10*3/uL (ref 0.0–0.4)
Eos: 2 %
Hematocrit: 45.6 % (ref 37.5–51.0)
Hemoglobin: 15 g/dL (ref 13.0–17.7)
Immature Grans (Abs): 0 10*3/uL (ref 0.0–0.1)
Immature Granulocytes: 0 %
Lymphocytes Absolute: 1.5 10*3/uL (ref 0.7–3.1)
Lymphs: 29 %
MCH: 31.4 pg (ref 26.6–33.0)
MCHC: 32.9 g/dL (ref 31.5–35.7)
MCV: 96 fL (ref 79–97)
Monocytes Absolute: 0.4 10*3/uL (ref 0.1–0.9)
Monocytes: 8 %
Neutrophils Absolute: 3.1 10*3/uL (ref 1.4–7.0)
Neutrophils: 60 %
Platelets: 305 10*3/uL (ref 150–450)
RBC: 4.77 x10E6/uL (ref 4.14–5.80)
RDW: 12.7 % (ref 11.6–15.4)
WBC: 5.1 10*3/uL (ref 3.4–10.8)

## 2021-10-08 LAB — TSH: TSH: 1.91 u[IU]/mL (ref 0.450–4.500)

## 2021-10-08 LAB — COMPREHENSIVE METABOLIC PANEL
ALT: 13 IU/L (ref 0–44)
AST: 14 IU/L (ref 0–40)
Albumin/Globulin Ratio: 2.7 — ABNORMAL HIGH (ref 1.2–2.2)
Albumin: 4.8 g/dL (ref 4.0–5.0)
Alkaline Phosphatase: 70 IU/L (ref 44–121)
BUN/Creatinine Ratio: 9 (ref 9–20)
BUN: 8 mg/dL (ref 6–20)
Bilirubin Total: 0.2 mg/dL (ref 0.0–1.2)
CO2: 26 mmol/L (ref 20–29)
Calcium: 9.5 mg/dL (ref 8.7–10.2)
Chloride: 102 mmol/L (ref 96–106)
Creatinine, Ser: 0.85 mg/dL (ref 0.76–1.27)
Globulin, Total: 1.8 g/dL (ref 1.5–4.5)
Glucose: 89 mg/dL (ref 70–99)
Potassium: 4.5 mmol/L (ref 3.5–5.2)
Sodium: 141 mmol/L (ref 134–144)
Total Protein: 6.6 g/dL (ref 6.0–8.5)
eGFR: 116 mL/min/{1.73_m2} (ref >59)

## 2021-10-08 LAB — LIPID PANEL W/O CHOL/HDL RATIO
Cholesterol, Total: 162 mg/dL (ref 100–199)
HDL: 47 mg/dL (ref >39)
LDL Chol Calc (NIH): 96 mg/dL (ref 0–99)
Triglycerides: 105 mg/dL (ref 0–149)
VLDL Cholesterol Cal: 19 mg/dL (ref 5–40)

## 2021-10-08 LAB — VITAMIN D 25 HYDROXY (VIT D DEFICIENCY, FRACTURES): Vit D, 25-Hydroxy: 22.6 ng/mL — ABNORMAL LOW (ref 30.0–100.0)

## 2021-10-08 LAB — VITAMIN B12: Vitamin B-12: 280 pg/mL (ref 232–1245)

## 2021-10-26 ENCOUNTER — Encounter: Payer: Self-pay | Admitting: Family Medicine

## 2022-10-08 ENCOUNTER — Encounter: Payer: Self-pay | Admitting: Family Medicine
